# Patient Record
Sex: Male | Born: 2000 | Race: White | Hispanic: No | Marital: Single
Health system: Southern US, Community
[De-identification: ages and names within clinical notes are randomized; demographics above are authoritative.]

## PROBLEM LIST (undated history)

## (undated) DIAGNOSIS — E669 Obesity, unspecified: Secondary | ICD-10-CM

## (undated) DIAGNOSIS — I10 Essential (primary) hypertension: Secondary | ICD-10-CM

## (undated) DIAGNOSIS — E785 Hyperlipidemia, unspecified: Secondary | ICD-10-CM

## (undated) HISTORY — DX: Hyperlipidemia, unspecified: E78.5

## (undated) HISTORY — DX: Essential (primary) hypertension: I10

## (undated) HISTORY — DX: Obesity, unspecified: E66.9

---
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
## 2020-09-27 ENCOUNTER — Emergency Department: Payer: Medicaid Other

## 2020-09-27 ENCOUNTER — Emergency Department
Admission: EM | Admit: 2020-09-27 | Discharge: 2020-09-27 | Disposition: A | Discharge status: Home / Self Care | Payer: Medicaid Other | Attending: Emergency Medicine | Admitting: Emergency Medicine

## 2020-09-27 ENCOUNTER — Encounter: Payer: Self-pay | Admitting: Emergency Medicine

## 2020-09-27 ENCOUNTER — Other Ambulatory Visit: Payer: Self-pay

## 2020-09-27 DIAGNOSIS — E860 Dehydration: Secondary | ICD-10-CM | POA: Diagnosis not present

## 2020-09-27 DIAGNOSIS — U071 COVID-19: Secondary | ICD-10-CM

## 2020-09-27 DIAGNOSIS — K529 Noninfective gastroenteritis and colitis, unspecified: Secondary | ICD-10-CM | POA: Diagnosis not present

## 2020-09-27 DIAGNOSIS — E876 Hypokalemia: Secondary | ICD-10-CM

## 2020-09-27 DIAGNOSIS — R000 Tachycardia, unspecified: Secondary | ICD-10-CM | POA: Insufficient documentation

## 2020-09-27 DIAGNOSIS — R059 Cough, unspecified: Secondary | ICD-10-CM | POA: Diagnosis present

## 2020-09-27 DIAGNOSIS — I10 Essential (primary) hypertension: Secondary | ICD-10-CM | POA: Diagnosis not present

## 2020-09-27 DIAGNOSIS — Z79899 Other long term (current) drug therapy: Secondary | ICD-10-CM | POA: Diagnosis not present

## 2020-09-27 DIAGNOSIS — R7401 Elevation of levels of liver transaminase levels: Secondary | ICD-10-CM

## 2020-09-27 HISTORY — DX: Essential (primary) hypertension: I10

## 2020-09-27 LAB — HEPATITIS PANEL, ACUTE
HCV Ab: NONREACTIVE
Hep A IgM: NONREACTIVE
Hep B C IgM: NONREACTIVE
Hepatitis B Surface Ag: NONREACTIVE

## 2020-09-27 LAB — CBC WITH DIFFERENTIAL/PLATELET
Abs Immature Granulocytes: 0.01 10*3/uL (ref 0.00–0.07)
Basophils Absolute: 0 10*3/uL (ref 0.0–0.1)
Basophils Relative: 1 %
Eosinophils Absolute: 0 10*3/uL (ref 0.0–0.5)
Eosinophils Relative: 0 %
HCT: 46.2 % (ref 39.0–52.0)
Hemoglobin: 15.4 g/dL (ref 13.0–17.0)
Immature Granulocytes: 0 %
Lymphocytes Relative: 19 %
Lymphs Abs: 1 10*3/uL (ref 0.7–4.0)
MCH: 27.6 pg (ref 26.0–34.0)
MCHC: 33.3 g/dL (ref 30.0–36.0)
MCV: 82.9 fL (ref 80.0–100.0)
Monocytes Absolute: 0.5 10*3/uL (ref 0.1–1.0)
Monocytes Relative: 9 %
Neutro Abs: 3.8 10*3/uL (ref 1.7–7.7)
Neutrophils Relative %: 71 %
Platelets: 221 10*3/uL (ref 150–400)
RBC: 5.57 MIL/uL (ref 4.22–5.81)
RDW: 12.5 % (ref 11.5–15.5)
WBC: 5.3 10*3/uL (ref 4.0–10.5)
nRBC: 0 % (ref 0.0–0.2)

## 2020-09-27 LAB — COMPREHENSIVE METABOLIC PANEL
ALT: 76 U/L — ABNORMAL HIGH (ref 0–44)
AST: 57 U/L — ABNORMAL HIGH (ref 15–41)
Albumin: 4.3 g/dL (ref 3.5–5.0)
Alkaline Phosphatase: 76 U/L (ref 38–126)
Anion gap: 13 (ref 5–15)
BUN: 8 mg/dL (ref 6–20)
CO2: 28 mmol/L (ref 22–32)
Calcium: 9.2 mg/dL (ref 8.9–10.3)
Chloride: 92 mmol/L — ABNORMAL LOW (ref 98–111)
Creatinine, Ser: 1.05 mg/dL (ref 0.61–1.24)
GFR, Estimated: >60 mL/min (ref >60)
Glucose, Bld: 108 mg/dL — ABNORMAL HIGH (ref 70–99)
Potassium: 3.1 mmol/L — ABNORMAL LOW (ref 3.5–5.1)
Sodium: 133 mmol/L — ABNORMAL LOW (ref 135–145)
Total Bilirubin: 0.6 mg/dL (ref 0.3–1.2)
Total Protein: 8.3 g/dL — ABNORMAL HIGH (ref 6.5–8.1)

## 2020-09-27 LAB — MAGNESIUM: Magnesium: 2.1 mg/dL (ref 1.7–2.4)

## 2020-09-27 LAB — RESP PANEL BY RT-PCR (FLU A&B, COVID) ARPGX2
Influenza A by PCR: NEGATIVE
Influenza B by PCR: NEGATIVE
SARS Coronavirus 2 by RT PCR: POSITIVE — AB

## 2020-09-27 LAB — CK: Total CK: 199 U/L (ref 49–397)

## 2020-09-27 IMAGING — CR DG CHEST 2V
2 series · 2 of 2 positions shown · non-contrast
Comparison: None.

CLINICAL DATA: Tachypnea

EXAM:
CHEST - 2 VIEW

[chest pa]
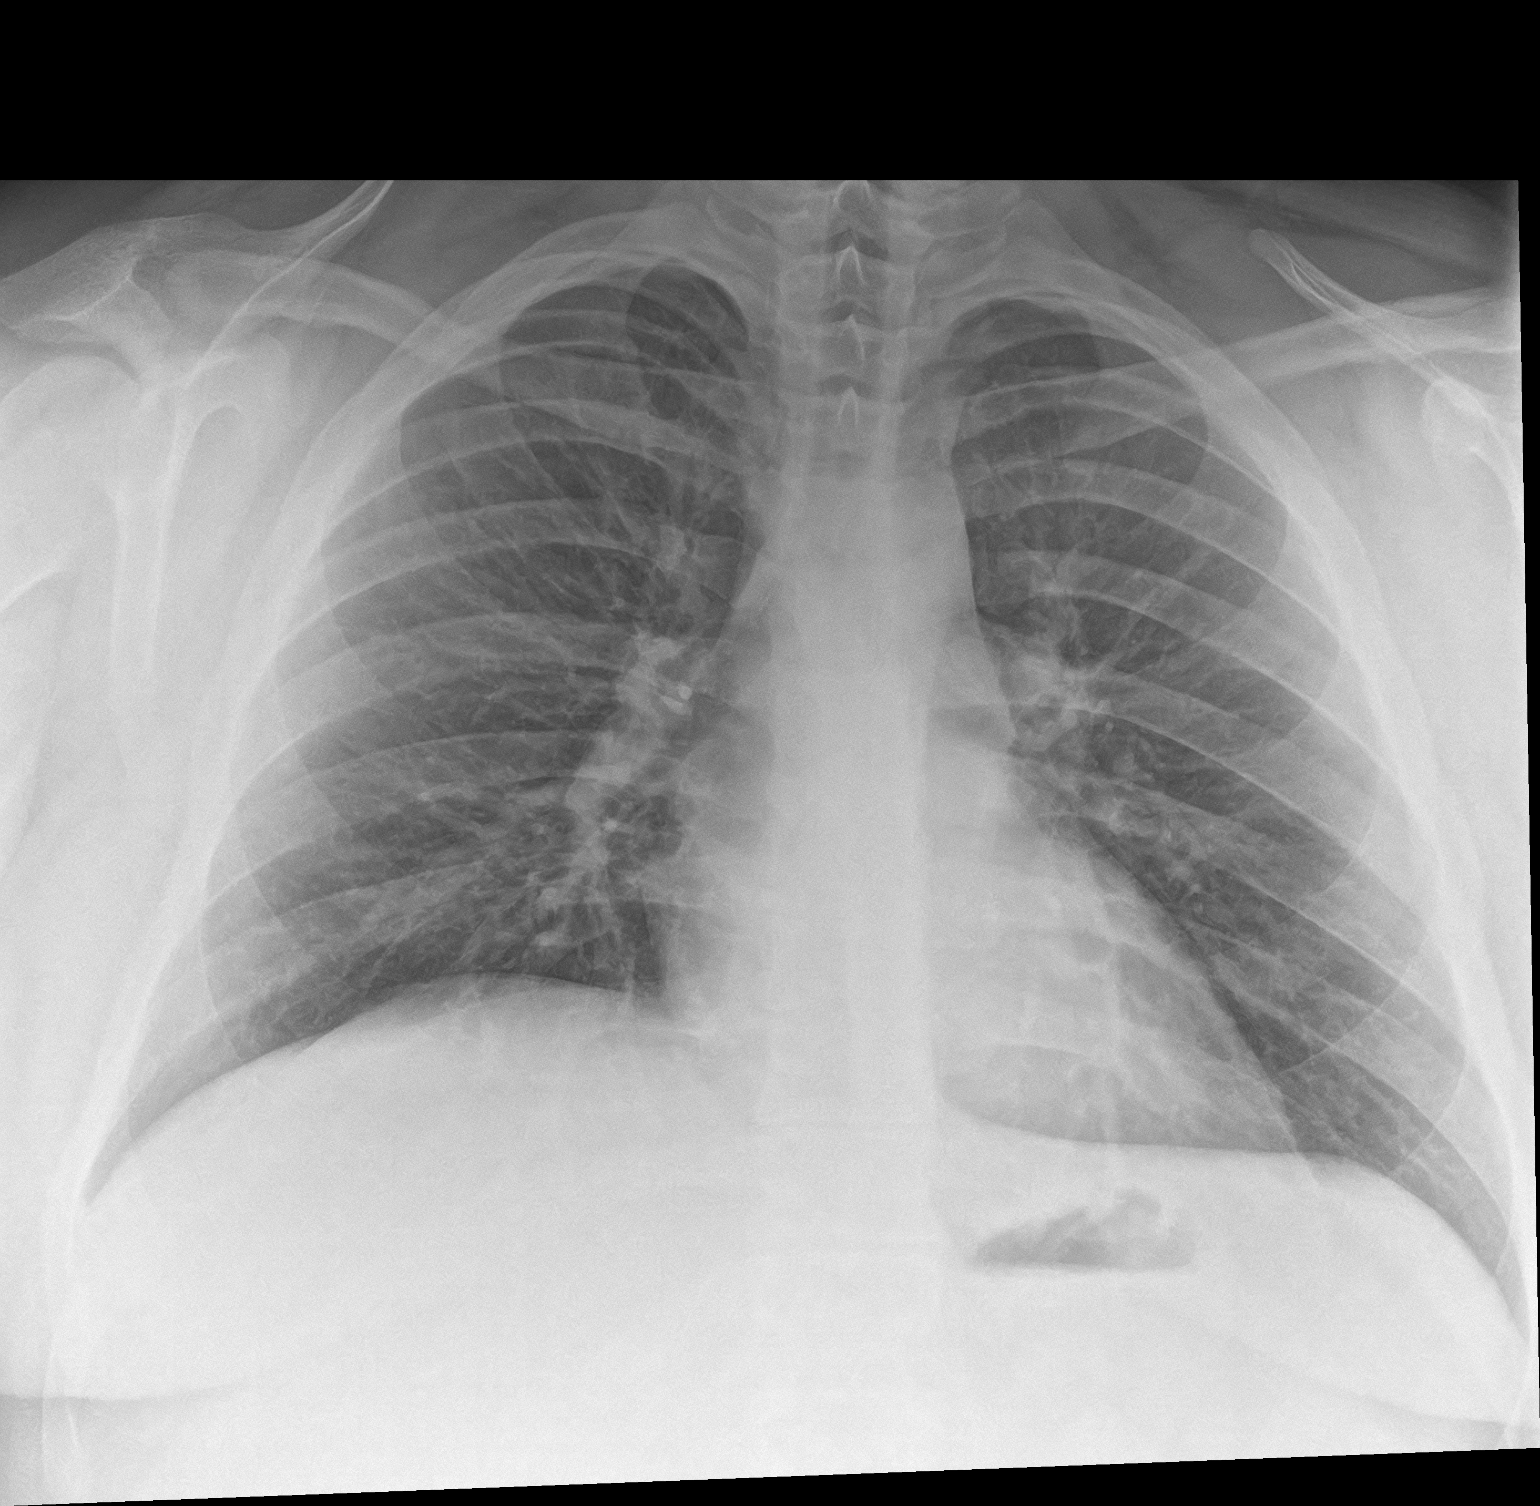

[chest lat]
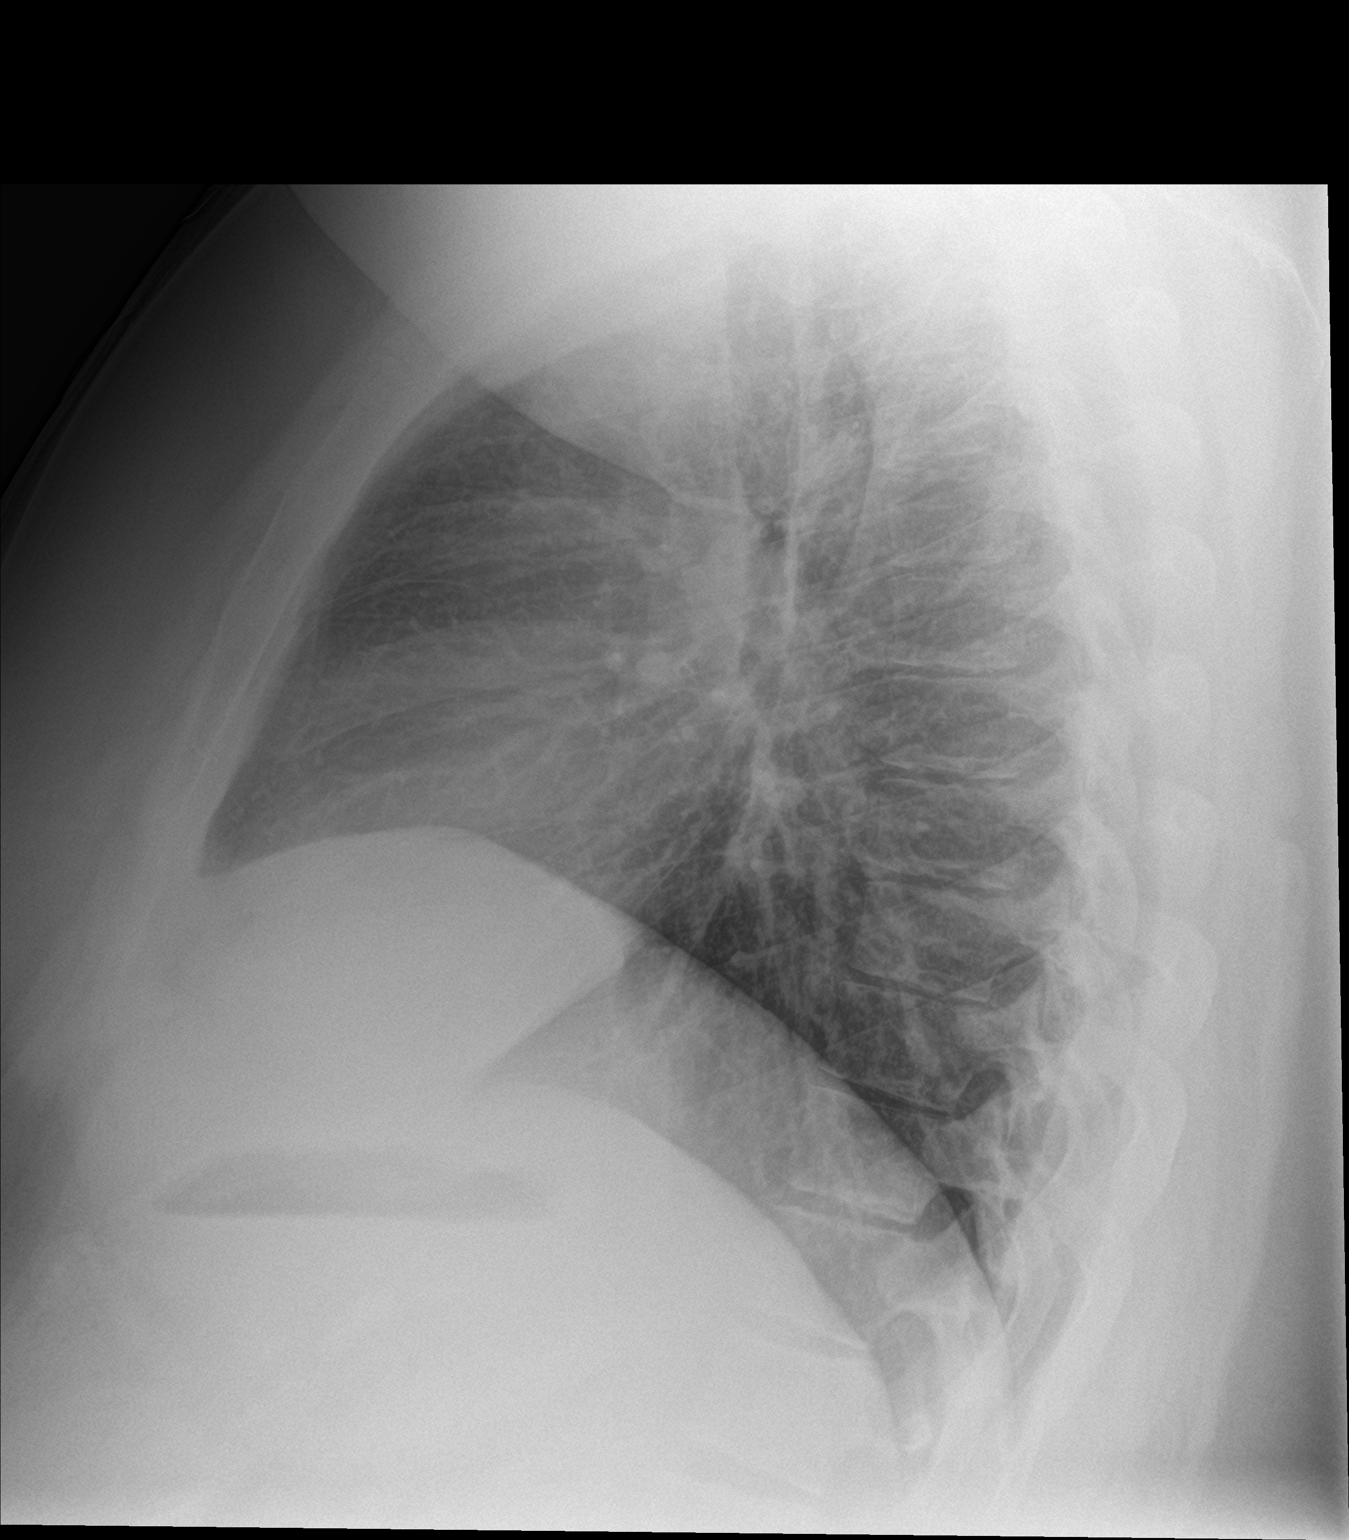

[2 of 2 positions shown; findings below may reference images not displayed]

FINDINGS: Lungs are clear. Heart size and pulmonary vascularity are normal. No
adenopathy. No pneumothorax. No bone lesions.
IMPRESSION: Lungs clear.  Cardiac silhouette normal.

## 2020-09-27 MED ORDER — POTASSIUM CHLORIDE CRYS ER 20 MEQ PO TBCR
40.0000 meq | EXTENDED_RELEASE_TABLET | Freq: Once | ORAL | Status: AC
  Administered 2020-09-27: 40 meq via ORAL
  Filled 2020-09-27: qty 2

## 2020-09-27 MED ORDER — NIRMATRELVIR/RITONAVIR (PAXLOVID) TABLET (RENAL DOSING): 2.0000 | ORAL_TABLET | Freq: Two times a day (BID) | ORAL | 0 refills | Status: AC

## 2020-09-27 MED ORDER — LACTATED RINGERS IV BOLUS
1000.0000 mL | Freq: Once | INTRAVENOUS | Status: AC
  Administered 2020-09-27: 1000 mL via INTRAVENOUS

## 2020-09-27 MED ORDER — ONDANSETRON HCL 4 MG PO TABS: 4.0000 mg | ORAL_TABLET | Freq: Three times a day (TID) | ORAL | 0 refills | Status: AC | PRN

## 2020-09-27 MED ORDER — KETOROLAC TROMETHAMINE 30 MG/ML IJ SOLN
30.0000 mg | Freq: Once | INTRAMUSCULAR | Status: AC
  Administered 2020-09-27: 30 mg via INTRAVENOUS
  Filled 2020-09-27: qty 1

## 2020-09-27 NOTE — ED Triage Notes (Signed)
First Nurse Note:  C/O 'joints locking up and dizziness'. And fever yesterday.  AAOx3.  Skin warm and dry. NAD

## 2020-09-27 NOTE — ED Triage Notes (Signed)
Pt states symptoms started Saturday after dinner, states after awakening vomited and noted joint point, Pt states intermittent joint pain and swelling. Pt states is supposed to take fluid pills and HTN medications however has not been taking to aid in digestion. Pt noted to be diaphoretic and tachycardic in triage.

## 2020-09-27 NOTE — ED Provider Notes (Signed)
[facility] Emergency Department Provider Note  ____________________________________________   Event Date/Time   First MD Initiated Contact with Patient 09/27/20 [id number]     (approximate)  I have reviewed the triage vital signs and the nursing notes.   HISTORY  Chief Complaint No chief complaint on file.   HPI Raymond Archer is a 20 y.o. male with a past medical history of hypertension and obesity who presents  Approximately 3 days of some nonbloody nonbilious vomiting and diarrhea associate with some crampy abdominal pain and body aches and nonproductive cough..  Patient states he felt it was so sore that he can hardly walk yesterday.  Endorses little bit of a headache.  He denies any fevers, chest chills, chest pain, shortness of breath, urinary symptoms, blood in his stool or emesis, rash or focal extremity pain.  No recent falls or injuries.  He denies any illicit drug use, tobacco use or EtOH use.  He thinks he may have gotten food poisoning but is not sure.  He denies any other acute concerns at this time.         Past Medical History:  Diagnosis Date  . Hypertension     There are no problems to display for this patient.   History reviewed. No pertinent surgical history.  Prior to Admission medications   Medication Sig Start Date End Date Taking? Authorizing Provider  nirmatrelvir/ritonavir EUA, renal dosing, (PAXLOVID) TABS Take 2 tablets by mouth 2 (two) times daily for 5 days. Patient GFR is >60. Take nirmatrelvir (150 mg) one tablet twice daily for 5 days and ritonavir (100 mg) one tablet twice daily for 5 days. 09/27/20 10/02/20 Yes Lucrezia Starch, MD  ondansetron (ZOFRAN) 4 MG tablet Take 1 tablet (4 mg total) by mouth every 8 (eight) hours as needed for up to 10 doses for nausea or vomiting. 09/27/20  Yes Lucrezia Starch, MD  cetirizine (ZYRTEC) 10 MG tablet Take by mouth. 10/05/17 10/05/18  [provider]  enalapril (VASOTEC) 20  MG tablet Take by mouth. 10/05/17 10/17/18  [provider]    Allergies Patient has no known allergies.  No family history on file.  Social History Social History   Tobacco Use  . Smoking status: Never Smoker  . Smokeless tobacco: Never Used  Substance Use Topics  . Alcohol use: Not Currently  . Drug use: Not Currently    Review of Systems  Review of Systems  Constitutional: Positive for malaise/fatigue. Negative for chills and fever.  HENT: Negative for sore throat.   Eyes: Negative for pain.  Respiratory: Positive for cough. Negative for stridor.   Cardiovascular: Negative for chest pain.  Gastrointestinal: Positive for abdominal pain, diarrhea, nausea and vomiting.  Musculoskeletal: Positive for myalgias.  Skin: Negative for rash.  Neurological: Negative for seizures, loss of consciousness and headaches.  Psychiatric/Behavioral: Negative for suicidal ideas.  All other systems reviewed and are negative.     ____________________________________________   PHYSICAL EXAM:  VITAL SIGNS: ED Triage Vitals  Enc Vitals Group     BP 09/27/20 0901 (!) 141/111     Pulse Rate 09/27/20 0901 (!) 117     Resp 09/27/20 0901 (!) 26     Temp 09/27/20 0901 99.4 F (37.4 C)     Temp Source 09/27/20 0901 Oral     SpO2 09/27/20 0901 96 %     Weight 09/27/20 0907 (!) 400 lb (181.4 kg)     Height 09/27/20 0907 5\' 9"  (1.753 m)  Head Circumference --      Peak Flow --      Pain Score 09/27/20 0907 0     Pain Loc --      Pain Edu? --      Excl. in [place]? --    Vitals:   09/27/20 0901 09/27/20 1123  BP: (!) 141/111 (!) 148/78  Pulse: (!) 117 98  Resp: (!) 26 20  Temp: 99.4 F (37.4 C)   SpO2: 96% 98%   Physical Exam Vitals and nursing note reviewed.  Constitutional:      Appearance: He is well-developed. He is obese.  HENT:     Head: Normocephalic and atraumatic.     Right Ear: External ear normal.     Left Ear: External ear normal.     Nose: Nose normal.   Eyes:     Conjunctiva/sclera: Conjunctivae normal.  Cardiovascular:     Rate and Rhythm: Regular rhythm. Tachycardia present.     Heart sounds: No murmur heard.   Pulmonary:     Effort: Pulmonary effort is normal. Tachypnea present. No respiratory distress.     Breath sounds: Normal breath sounds.  Abdominal:     Palpations: Abdomen is soft.     Tenderness: There is no abdominal tenderness.  Musculoskeletal:     Cervical back: Neck supple.  Skin:    General: Skin is warm and dry.     Capillary Refill: Capillary refill takes less than 2 seconds.  Neurological:     Mental Status: He is alert and oriented to person, place, and time.  Psychiatric:        Mood and Affect: Mood normal.      ____________________________________________   LABS (all labs ordered are listed, but only abnormal results are displayed)  Labs Reviewed  RESP PANEL BY RT-PCR (FLU A&B, COVID) ARPGX2 - Abnormal; Notable for the following components:      Result Value   SARS Coronavirus 2 by RT PCR POSITIVE (*)    All other components within normal limits  COMPREHENSIVE METABOLIC PANEL - Abnormal; Notable for the following components:   Sodium 133 (*)    Potassium 3.1 (*)    Chloride 92 (*)    Glucose, Bld 108 (*)    Total Protein 8.3 (*)    AST 57 (*)    ALT 76 (*)    All other components within normal limits  CBC WITH DIFFERENTIAL/PLATELET  MAGNESIUM  CK  HEPATITIS PANEL, ACUTE   ____________________________________________  EKG  Sinus tachycardia with ventricular of 123, normal axis, unremarkable intervals without evidence of ischemia or significant underlying arrhythmia. ____________________________________________  RADIOLOGY  ED MD interpretation: No focal consolidation, large effusion, significant edema, pneumothorax or any other clear acute thoracic process.  Official radiology report(s): DG Chest 2 View  Result Date: 09/27/2020 CLINICAL DATA:  Tachypnea EXAM: CHEST - 2 VIEW  COMPARISON:  None. FINDINGS: Lungs are clear. Heart size and pulmonary vascularity are normal. No adenopathy. No pneumothorax. No bone lesions. IMPRESSION: Lungs clear.  Cardiac silhouette normal. Electronically Signed   By: Lowella Grip III M.D.   On: 09/27/2020 10:29    ____________________________________________   PROCEDURES  Procedure(s) performed (including Critical Care):  Procedures   ____________________________________________   INITIAL IMPRESSION / ASSESSMENT AND PLAN / ED COURSE      Patient presents with above to history exam for assessment of some nonbloody nonbilious vomiting, diarrhea, crampy abdominal pain and cough.  On arrival he is tachycardic and hypertensive with otherwise stable  vital signs on room air.  He does appear dehydrated on exam.  Differential includes metabolic derangements, acute bowel process including close COVID and influenza, bronchitis with an element of gastroenteritis.  He has no abdominal tenderness leukocytosis or fever to suggest appendicitis, diverticulitis, cholecystitis or pancreatitis at this time.  CBC shows no leukocytosis or acute anemia.  Chest x-ray has no evidence of pneumonia.  ECG shows no evidence of ischemia.  CMP shows a K of 3.1 and mild transaminitis with otherwise no significant electrolyte or metabolic derangements.  CK and magnesium are unremarkable.  Patient is found to be COVID-positive.  Suspect this is likely etiology of her transaminitis although we will send hepatitis panel.  Advised that he can follow-up this result with his PCP in the next 2 or 3 days.  Given he had improvement his heart rate after IV fluids and is tolerating p.o. I think he is stable for discharge with outpatient follow-up.  Rx written for Zofran and Paxil.  Discharged stable condition.  Strict return precautions advised and discussed.        ____________________________________________   FINAL CLINICAL IMPRESSION(S) / ED DIAGNOSES  Final  diagnoses:  Gastroenteritis  Dehydration  Hypokalemia  Transaminitis  COVID    Medications  lactated ringers bolus 1,000 mL (1,000 mLs Intravenous New Bag/Given 09/27/20 0940)  potassium chloride SA (KLOR-CON) CR tablet 40 mEq (40 mEq Oral Given 09/27/20 1012)  ketorolac (TORADOL) 30 MG/ML injection 30 mg (30 mg Intravenous Given 09/27/20 1012)     ED Discharge Orders         Ordered    nirmatrelvir/ritonavir EUA, renal dosing, (PAXLOVID) TABS  2 times daily        09/27/20 1109    ondansetron (ZOFRAN) 4 MG tablet  Every 8 hours PRN        09/27/20 1109           Note:  This document was prepared using Dragon voice recognition software and may include unintentional dictation errors.   Lucrezia Starch, MD 09/27/20 1128

## 2020-09-27 NOTE — ED Notes (Signed)
Pt verbalized understanding of d/c instructions at this time. Prescriptions and follow-up care reviewed at this time. Pt ambulatory to ED lobby, NAD noted, RR even and unlabored.

## 2020-11-16 ENCOUNTER — Encounter: Payer: Self-pay | Admitting: Intensive Care

## 2020-11-16 ENCOUNTER — Emergency Department
Admission: EM | Admit: 2020-11-16 | Discharge: 2020-11-16 | Disposition: A | Discharge status: Home / Self Care | Payer: Medicaid Other | Attending: Emergency Medicine | Admitting: Emergency Medicine

## 2020-11-16 ENCOUNTER — Other Ambulatory Visit: Payer: Self-pay

## 2020-11-16 DIAGNOSIS — K602 Anal fissure, unspecified: Secondary | ICD-10-CM | POA: Diagnosis not present

## 2020-11-16 DIAGNOSIS — Z79899 Other long term (current) drug therapy: Secondary | ICD-10-CM | POA: Diagnosis not present

## 2020-11-16 DIAGNOSIS — I10 Essential (primary) hypertension: Secondary | ICD-10-CM | POA: Diagnosis not present

## 2020-11-16 DIAGNOSIS — L304 Erythema intertrigo: Secondary | ICD-10-CM | POA: Diagnosis not present

## 2020-11-16 DIAGNOSIS — R21 Rash and other nonspecific skin eruption: Secondary | ICD-10-CM | POA: Diagnosis present

## 2020-11-16 LAB — CBC
HCT: 41.1 % (ref 39.0–52.0)
Hemoglobin: 14.2 g/dL (ref 13.0–17.0)
MCH: 28.9 pg (ref 26.0–34.0)
MCHC: 34.5 g/dL (ref 30.0–36.0)
MCV: 83.5 fL (ref 80.0–100.0)
Platelets: 316 10*3/uL (ref 150–400)
RBC: 4.92 MIL/uL (ref 4.22–5.81)
RDW: 12.7 % (ref 11.5–15.5)
WBC: 9.1 10*3/uL (ref 4.0–10.5)
nRBC: 0 % (ref 0.0–0.2)

## 2020-11-16 LAB — URINALYSIS, COMPLETE (UACMP) WITH MICROSCOPIC
Bacteria, UA: NONE SEEN
Bilirubin Urine: NEGATIVE
Glucose, UA: NEGATIVE mg/dL
Hgb urine dipstick: NEGATIVE
Ketones, ur: NEGATIVE mg/dL
Leukocytes,Ua: NEGATIVE
Nitrite: NEGATIVE
Protein, ur: NEGATIVE mg/dL
Specific Gravity, Urine: 1.019 (ref 1.005–1.030)
Squamous Epithelial / HPF: NONE SEEN (ref 0–5)
pH: 6 (ref 5.0–8.0)

## 2020-11-16 LAB — COMPREHENSIVE METABOLIC PANEL
ALT: 36 U/L (ref 0–44)
AST: 21 U/L (ref 15–41)
Albumin: 3.9 g/dL (ref 3.5–5.0)
Alkaline Phosphatase: 57 U/L (ref 38–126)
Anion gap: 10 (ref 5–15)
BUN: 18 mg/dL (ref 6–20)
CO2: 26 mmol/L (ref 22–32)
Calcium: 9.4 mg/dL (ref 8.9–10.3)
Chloride: 98 mmol/L (ref 98–111)
Creatinine, Ser: 0.7 mg/dL (ref 0.61–1.24)
GFR, Estimated: >60 mL/min (ref >60)
Glucose, Bld: 99 mg/dL (ref 70–99)
Potassium: 3.7 mmol/L (ref 3.5–5.1)
Sodium: 134 mmol/L — ABNORMAL LOW (ref 135–145)
Total Bilirubin: 0.8 mg/dL (ref 0.3–1.2)
Total Protein: 7.5 g/dL (ref 6.5–8.1)

## 2020-11-16 LAB — LIPASE, BLOOD: Lipase: 28 U/L (ref 11–51)

## 2020-11-16 MED ORDER — FLUCONAZOLE 150 MG PO TABS: 150.0000 mg | ORAL_TABLET | ORAL | 0 refills | Status: AC

## 2020-11-16 NOTE — ED Triage Notes (Signed)
Patient c/o abdominal pain, rectal bleeding, and diarrhea that started today.

## 2020-11-16 NOTE — Discharge Instructions (Addendum)
? ?  Please return to the emergency room right away if you are to develop a fever, severe nausea, your pain becomes severe or worsens, you are unable to keep food down, begin vomiting any dark or bloody fluid, you develop any dark or bloody stools, feel dehydrated, or other new concerns or symptoms arise. ? ?

## 2020-11-16 NOTE — ED Provider Notes (Signed)
[facility] Emergency Department Provider Note   ____________________________________________   Event Date/Time   First MD Initiated Contact with Patient 11/16/20 1125     (approximate)  I have reviewed the triage vital signs and the nursing notes.   HISTORY  Chief Complaint Rectal Bleeding and Diarrhea    HPI Raymond Archer is a 20 y.o. male ports no major medical history except previously broke a bone in his hypertension  For the last 10 years he notes at times that he will get a little bit of discomfort in around his rectum and sometimes will see a small amount of blood when he wipes the toilet and feels like his area around his rectum will get irritated.  This morning he woke up and he got up to use the bathroom that he had about 2-3 loose movements with some blood noticed on tissue when he wipes.  Denies that there is any blood in his stool itself.  Also feels little bit of irritation or discomfort around his rectum.  No nausea vomiting no fevers or chills.  Denies any emesis.  Reports is not having any abdominal pain no stomach pains, the area of irritation is around his rectum and only causes pain and discomfort when he sits.  Currently not in any pain or discomfort as he is laying down but if he sits down he notices the area feels sore   Past Medical History:  Diagnosis Date   Hyperlipidemia    Hypertension    Obesity     There are no problems to display for this patient.   History reviewed. No pertinent surgical history.  Prior to Admission medications   Medication Sig Start Date End Date Taking? Authorizing Provider  fluconazole (DIFLUCAN) 150 MG tablet Take 1 tablet (150 mg total) by mouth once a week. 11/16/20  Yes Delman Kitten, MD  cetirizine (ZYRTEC) 10 MG tablet Take by mouth. 10/05/17 10/05/18  [provider]  enalapril (VASOTEC) 20 MG tablet Take by mouth. 10/05/17 10/17/18  [provider]  ondansetron  (ZOFRAN) 4 MG tablet Take 1 tablet (4 mg total) by mouth every 8 (eight) hours as needed for up to 10 doses for nausea or vomiting. 09/27/20   Lucrezia Starch, MD    Allergies Bee pollen and Fluticasone propionate  History reviewed. No pertinent family history.  Social History Social History   Tobacco Use   Smoking status: Never   Smokeless tobacco: Never  Vaping Use   Vaping Use: Every day  Substance Use Topics   Alcohol use: Not Currently   Drug use: Not Currently    Review of Systems Constitutional: No fever/chills ENT: No sore throat. Cardiovascular: Denies chest pain. Respiratory: Denies shortness of breath. Gastrointestinal: No abdominal pain.  See HPI Genitourinary: Negative for dysuria. Musculoskeletal: Negative for back pain. Skin: Negative for rash reports he cannot see the area around his rectum where it does feel irritated. Neurological: Negative for headaches or weakness.    ____________________________________________   PHYSICAL EXAM:  VITAL SIGNS: ED Triage Vitals [11/16/20 1001]  Enc Vitals Group     BP 128/76     Pulse Rate (!) 101     Resp 18     Temp 98.7 F (37.1 C)     Temp Source Oral     SpO2 96 %     Weight (!) 380 lb (172.4 kg)     Height '5\' 10"'$  (1.778 m)     Head Circumference  Peak Flow      Pain Score 7     Pain Loc      Pain Edu?      Excl. in [place]?     Constitutional: Alert and oriented. Well appearing and in no acute distress.  Ambulates without difficulty.  Very pleasant. Eyes: Conjunctivae are normal. Head: Atraumatic. Nose: No congestion/rhinnorhea. Mouth/Throat: Mucous membranes are moist. Neck: No stridor.  Cardiovascular: Normal rate, regular rhythm.  Good peripheral circulation. Respiratory: Normal respiratory effort.  No retractions.  Gastrointestinal: Soft and nontender. No distention. Genitourinary: Normal appearance of the buttock, however in the gluteal crease the patient has an area of skin darkening  slight irritation and then also what appears to be a very small rectal fissure without evidence of abscess.  No hemorrhoids noted.  No active bleeding.  Morbidly obese.  No right lower quadrant pain.  No pain McBurney's point.  No reproducible abdominal pain on exam. Musculoskeletal: No lower extremity tenderness nor edema. Neurologic:  Normal speech and language. No gross focal neurologic deficits are appreciated.  Skin:  Skin is warm, dry and intact. No rash noted. Psychiatric: Mood and affect are normal. Speech and behavior are normal.  ____________________________________________   LABS (all labs ordered are listed, but only abnormal results are displayed)  Labs Reviewed  COMPREHENSIVE METABOLIC PANEL - Abnormal; Notable for the following components:      Result Value   Sodium 134 (*)    All other components within normal limits  URINALYSIS, COMPLETE (UACMP) WITH MICROSCOPIC - Abnormal; Notable for the following components:   Color, Urine YELLOW (*)    APPearance CLEAR (*)    All other components within normal limits  LIPASE, BLOOD  CBC   ____________________________________________  EKG   ____________________________________________  RADIOLOGY   ____________________________________________   PROCEDURES  Procedure(s) performed: None  Procedures  Critical Care performed: No  ____________________________________________   INITIAL IMPRESSION / ASSESSMENT AND PLAN / ED COURSE  Pertinent labs & imaging results that were available during my care of the patient were reviewed by me and considered in my medical decision making (see chart for details).   Patient presents for gluteal cleft irritation, clinical exam appears consistent with gluteal cleft intertrigo.  Also see a small area that appears to be a small rectal fissure.  Patient reports has had multiple issues with this at the same she is primary care treats often with [employer].  We discussed treatment with  different creams or powders she reports that given he sweats a lot this is very difficult he would be interested in oral treatment option.  Will prescribe fluconazole q. weekly for 4 weeks.  Follow-up with PCP.  Very reassuring exam.  No associate abdominal pain nausea vomiting no fever blood work also reassuring no elevated white count.  No clinical signs or symptoms or exam findings suggestive of acute bacterial illness, appendicitis, cholelithiasis, enteritis etc.  Patient understanding comfortable plan for discharge.      ____________________________________________   FINAL CLINICAL IMPRESSION(S) / ED DIAGNOSES  Final diagnoses:  Intertrigo  Rectal fissure        Note:  This document was prepared using Dragon voice recognition software and may include unintentional dictation errors       Delman Kitten, MD 11/16/20 1200

## 2020-12-02 ENCOUNTER — Other Ambulatory Visit: Payer: Self-pay

## 2020-12-02 ENCOUNTER — Emergency Department
Admission: EM | Admit: 2020-12-02 | Discharge: 2020-12-02 | Disposition: A | Discharge status: Home / Self Care | Payer: Medicaid Other | Attending: Emergency Medicine | Admitting: Emergency Medicine

## 2020-12-02 ENCOUNTER — Emergency Department: Payer: Medicaid Other

## 2020-12-02 ENCOUNTER — Encounter: Payer: Self-pay | Admitting: Emergency Medicine

## 2020-12-02 DIAGNOSIS — I10 Essential (primary) hypertension: Secondary | ICD-10-CM | POA: Insufficient documentation

## 2020-12-02 DIAGNOSIS — G9389 Other specified disorders of brain: Secondary | ICD-10-CM | POA: Diagnosis not present

## 2020-12-02 DIAGNOSIS — Z79899 Other long term (current) drug therapy: Secondary | ICD-10-CM | POA: Insufficient documentation

## 2020-12-02 DIAGNOSIS — R519 Headache, unspecified: Secondary | ICD-10-CM | POA: Diagnosis present

## 2020-12-02 LAB — COMPREHENSIVE METABOLIC PANEL
ALT: 32 U/L (ref 0–44)
AST: 20 U/L (ref 15–41)
Albumin: 4 g/dL (ref 3.5–5.0)
Alkaline Phosphatase: 62 U/L (ref 38–126)
Anion gap: 9 (ref 5–15)
BUN: 9 mg/dL (ref 6–20)
CO2: 30 mmol/L (ref 22–32)
Calcium: 9.3 mg/dL (ref 8.9–10.3)
Chloride: 95 mmol/L — ABNORMAL LOW (ref 98–111)
Creatinine, Ser: 0.73 mg/dL (ref 0.61–1.24)
GFR, Estimated: >60 mL/min (ref >60)
Glucose, Bld: 103 mg/dL — ABNORMAL HIGH (ref 70–99)
Potassium: 4.5 mmol/L (ref 3.5–5.1)
Sodium: 134 mmol/L — ABNORMAL LOW (ref 135–145)
Total Bilirubin: 0.9 mg/dL (ref 0.3–1.2)
Total Protein: 7.5 g/dL (ref 6.5–8.1)

## 2020-12-02 LAB — CBC WITH DIFFERENTIAL/PLATELET
Abs Immature Granulocytes: 0.01 10*3/uL (ref 0.00–0.07)
Basophils Absolute: 0.1 10*3/uL (ref 0.0–0.1)
Basophils Relative: 1 %
Eosinophils Absolute: 0.1 10*3/uL (ref 0.0–0.5)
Eosinophils Relative: 1 %
HCT: 44.4 % (ref 39.0–52.0)
Hemoglobin: 14.9 g/dL (ref 13.0–17.0)
Immature Granulocytes: 0 %
Lymphocytes Relative: 22 %
Lymphs Abs: 2 10*3/uL (ref 0.7–4.0)
MCH: 28.9 pg (ref 26.0–34.0)
MCHC: 33.6 g/dL (ref 30.0–36.0)
MCV: 86.2 fL (ref 80.0–100.0)
Monocytes Absolute: 0.4 10*3/uL (ref 0.1–1.0)
Monocytes Relative: 5 %
Neutro Abs: 6.8 10*3/uL (ref 1.7–7.7)
Neutrophils Relative %: 71 %
Platelets: 289 10*3/uL (ref 150–400)
RBC: 5.15 MIL/uL (ref 4.22–5.81)
RDW: 12.5 % (ref 11.5–15.5)
WBC: 9.5 10*3/uL (ref 4.0–10.5)
nRBC: 0 % (ref 0.0–0.2)

## 2020-12-02 LAB — D-DIMER, QUANTITATIVE: D-Dimer, Quant: <0.27 ug/mL-FEU (ref 0.00–0.50)

## 2020-12-02 IMAGING — MR MR HEAD WO/W CM
16 series · 47 of 48 positions shown · IV contrast (10ml Gadavist)
Comparison: None.

CLINICAL DATA: Headache. Recent COVID infection and paxlovid
treatment

EXAM:
MRI HEAD WITHOUT AND WITH CONTRAST
MRI CERVICAL SPINE WITHOUT AND WITH CONTRAST
TECHNIQUE: Multiplanar, multiecho pulse sequences of the brain and surrounding
structures, and cervical spine, to include the craniocervical
junction and cervicothoracic junction, were obtained without and
with intravenous contrast.
CONTRAST:  10mL GADAVIST GADOBUTROL 1 MMOL/ML IV SOLN

[Series 5: ax dwi_tracew · axial · 3.0mm · 0.65mm/px · z∈[-74,+78]mm · 3 of 48 slices shown]
[im 1/48]
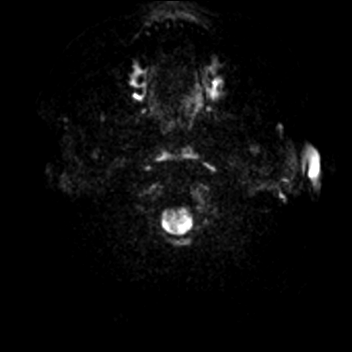
[im 24/48]
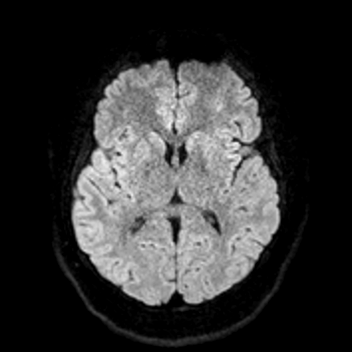
[im 48/48]
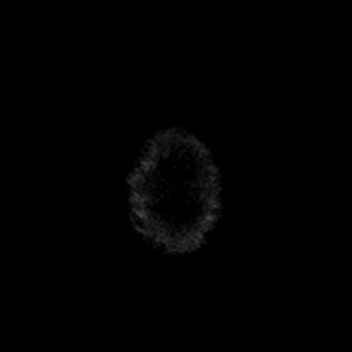

[Series 6: ax dwi_adc · axial · 3.0mm · 0.65mm/px · z∈[-74,+78]mm · 2 of 48 slices shown]
[im 1/48]
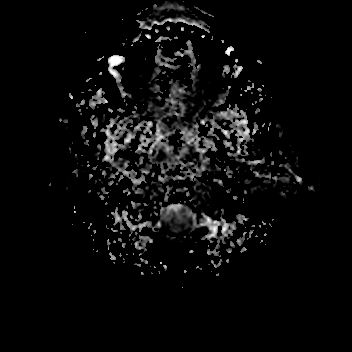
[im 48/48]
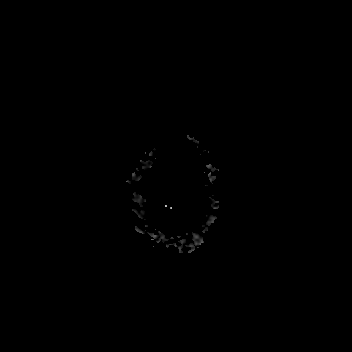

[Series 7: cor dwi_tracew · coronal · 5.0mm · 0.65mm/px · 2 of 40 slices shown]
[im 1/40]
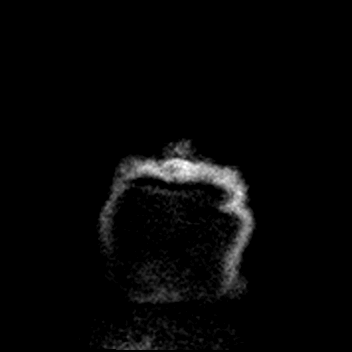
[im 40/40]
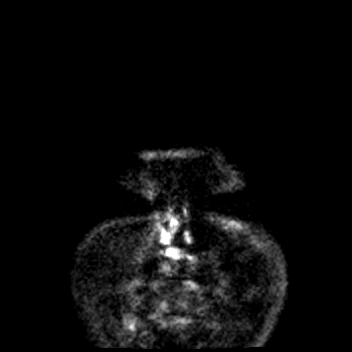

[Series 8: cor dwi_adc · coronal · 5.0mm · 0.65mm/px · 2 of 40 slices shown]
[im 1/40]
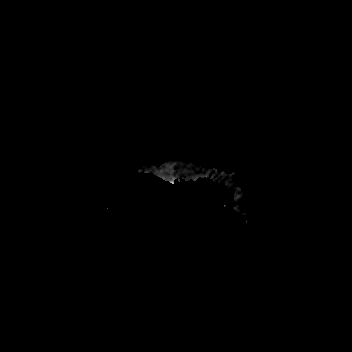
[im 40/40]
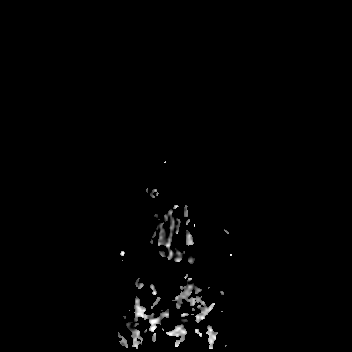

[Series 9: T1 · sagittal · 5.0mm · 0.62mm/px · 1 of 23 slices shown (1 of 2)]
[im 1/23]
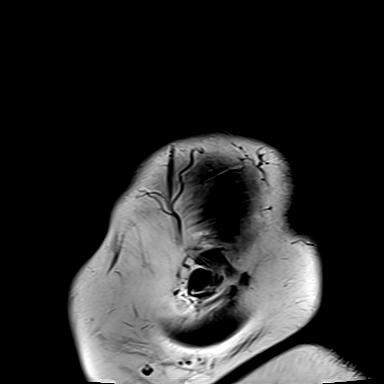

[Series 10: T2 · axial · 5.0mm · 0.53mm/px · 1 of 25 slices shown (1 of 2)]
[im 1/25]
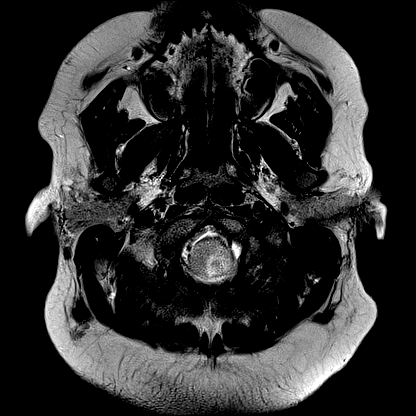

[Series 11: mag_images · axial · 3.0mm · 0.90mm/px · z∈[-85,+89]mm · 3 of 60 slices shown]
[im 1/60]
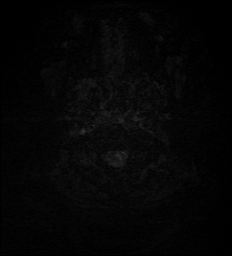
[im 30/60]
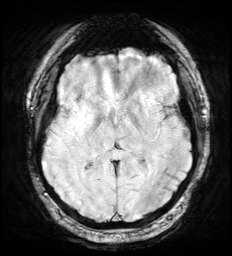
[im 60/60]
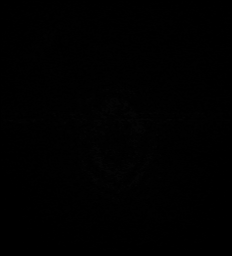

[Series 12: pha_images · axial · 3.0mm · 0.90mm/px · z∈[-85,+89]mm · 3 of 59 slices shown]
[im 1/59]
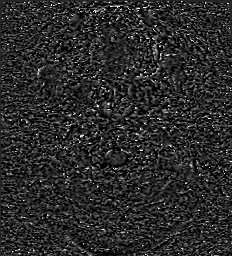
[im 30/59]
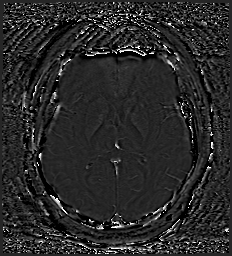
[im 59/59]
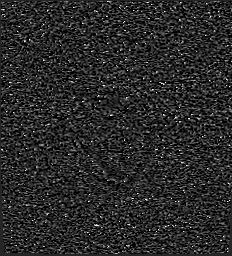

[Series 13: swi_images · axial · 3.0mm · 0.90mm/px · z∈[-85,+89]mm · 3 of 60 slices shown]
[im 1/60]
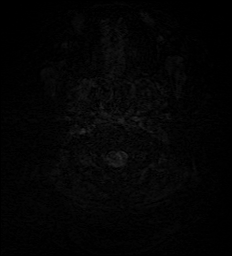
[im 30/60]
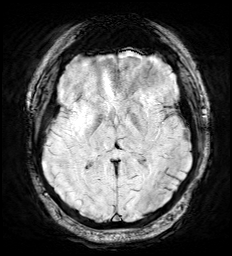
[im 60/60]
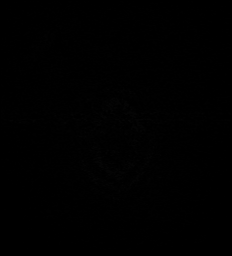

[Series 15: FLAIR · axial · 3.0mm · 0.53mm/px · z∈[-77,+87]mm · 3 of 60 slices shown]
[im 1/60]
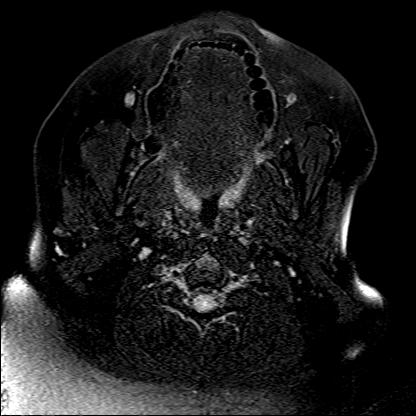
[im 30/60]
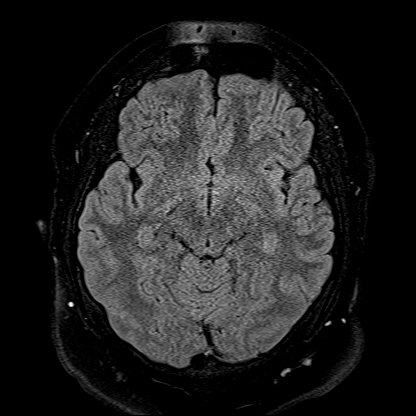
[im 60/60]
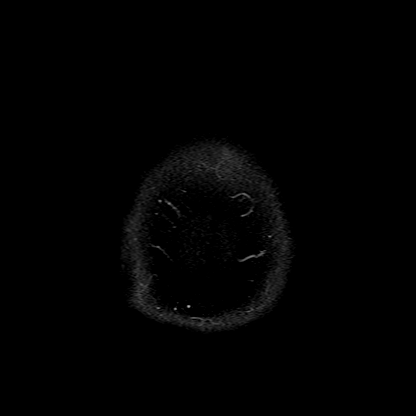

[Series 16: T1 · axial · 1.0mm · 0.98mm/px · z∈[-113,+75]mm · 9 of 192 slices shown (2 of 2)]
[im 1/192]
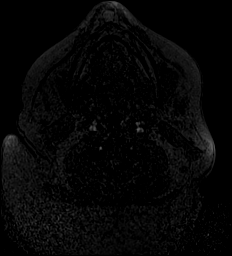
[im 22/192]
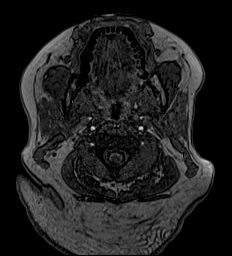
[im 43/192]
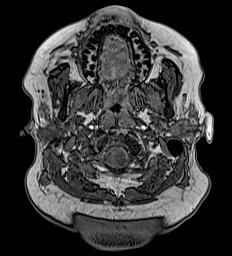
[im 64/192]
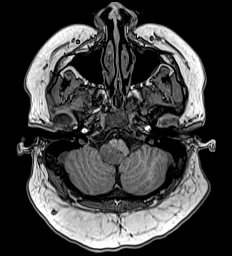
[im 85/192]
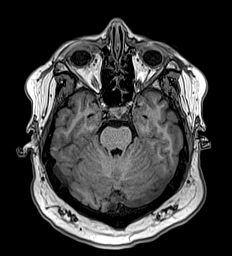
[im 107/192]
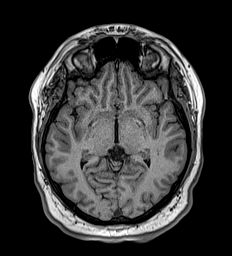
[im 128/192]
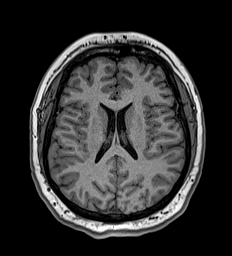
[im 170/192]
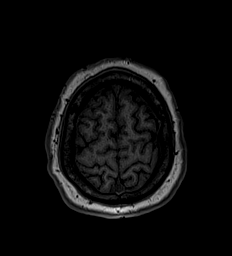
[im 192/192]
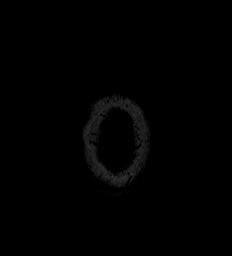

[Series 17: T2 · axial · 5.0mm · 0.53mm/px · z∈[-111,+71]mm · 2 of 32 slices shown (2 of 2)]
[im 1/32]
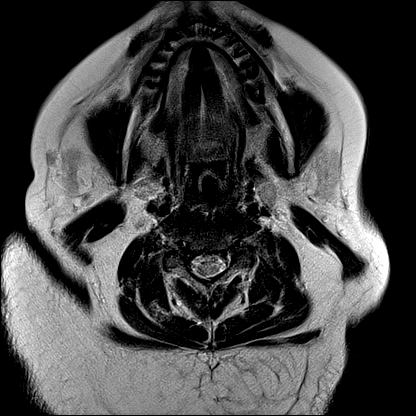
[im 32/32]
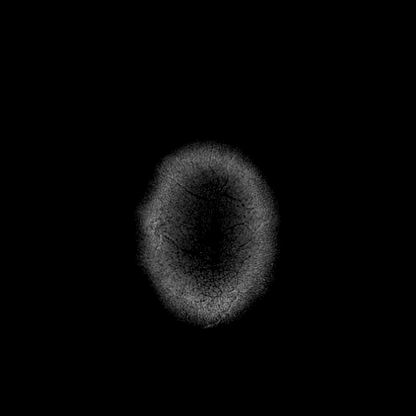

[Series 28: T2 post-contrast · coronal · 5.0mm · 0.57mm/px · 1 of 29 slices shown]
[im 1/29]
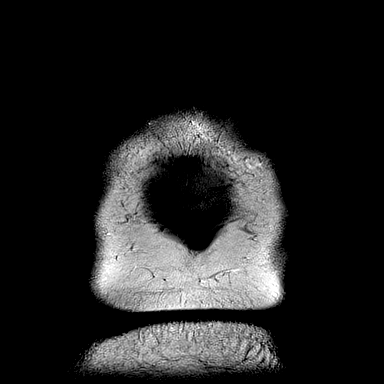

[Series 29: T1 post-contrast · axial · 1.0mm · 0.98mm/px · z∈[-113,+75]mm · 10 of 192 slices shown (1 of 3)]
[im 1/192]
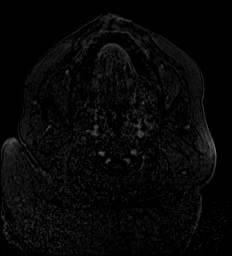
[im 22/192]
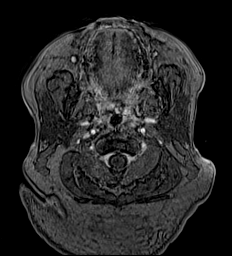
[im 43/192]
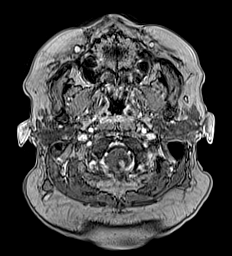
[im 64/192]
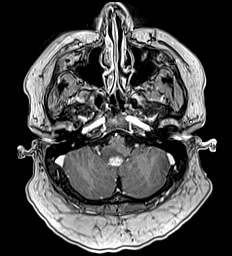
[im 85/192]
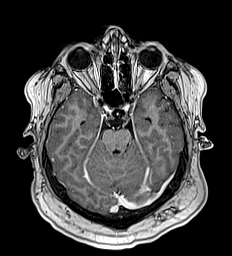
[im 107/192]
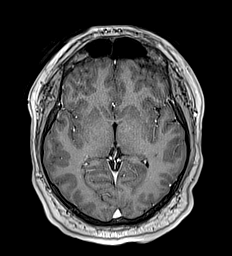
[im 128/192]
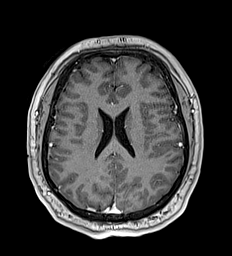
[im 149/192]
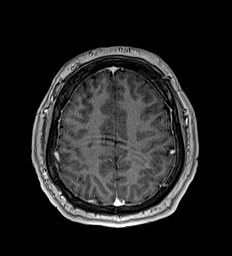
[im 170/192]
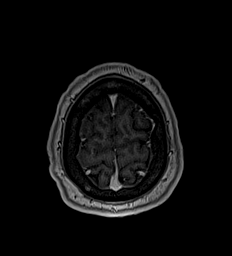
[im 192/192]
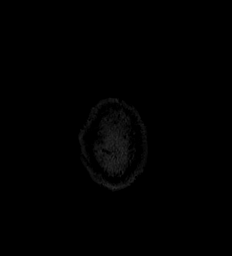

[Series 30: T1 post-contrast · coronal · 5.0mm · 0.57mm/px · 1 of 29 slices shown (2 of 3)]
[im 1/29]
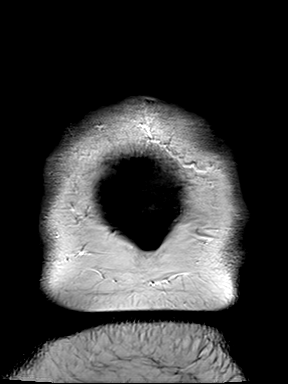

[Series 31: T1 post-contrast · sagittal · 5.0mm · 0.62mm/px · 1 of 23 slices shown (3 of 3)]
[im 1/23]
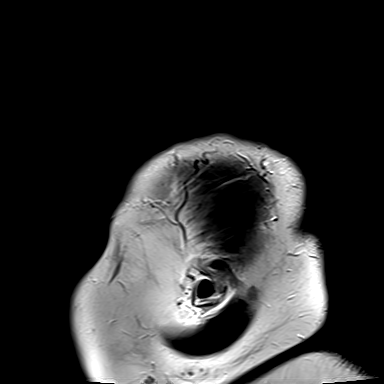

[47 of 48 positions shown; findings below may reference images not displayed]

FINDINGS: MRI HEAD FINDINGS

Brain: Ventricle size normal. Cerebral white matter normal. Negative
for acute infarct. No areas of hemorrhage or fluid collection

Mass lesion involving the medulla and upper cervical spinal cord.
The mass is expansile and appears intramedullary but does protrude
into the lower aspect of the fourth ventricle on the right. The mass
shows mild patchy enhancement. No blood products or cystic change.
No obstructive hydrocephalus.

Vascular: Normal arterial flow voids.

Skull and upper cervical spine: Negative

Sinuses/Orbits: Mild mucosal edema paranasal sinuses. Negative orbit

Other: None

MRI CERVICAL SPINE FINDINGS

Alignment: Normal

Vertebrae: Normal bone marrow.  Negative for fracture or mass

Cord: Expansile intramedullary mass centered at the craniocervical
junction. The mass has increased signal on T2. The mass infiltrates
the medulla, right greater than left. The mass appears to protrude
into the base of the fourth ventricle on the right. The mass
measures approximately 7 cm in length and shows mild enhancement
especially in the posterior medulla region. No syrinx. No evidence
of blood products.

Posterior Fossa, vertebral arteries, paraspinal tissues: Expansile
intramedullary mass lesion centered at the craniocervical junction
as described above.

No paraspinous mass or adenopathy in the neck.

Disc levels: Disc spaces are normal. No disc degeneration, disc
protrusion, or spinal stenosis.
IMPRESSION: 1. Large intramedullary expansile mass centered at the cranial
cervical junction. There is extension into the medulla and into the
upper cervical spinal cord. No hemorrhage or cystic change. There is
mild patchy enhancement especially in the posterior medulla
component of the mass. Findings most compatible with primary neural
neoplasm such as brainstem glioma or astrocytoma.
2. Negative for hydrocephalus.
3. These results were called by telephone at the time of
interpretation on [DATE] at [DATE] to provider KAKI ,
who verbally acknowledged these results.

## 2020-12-02 IMAGING — MR MR CERVICAL SPINE WO/W CM
5 of 8 series · 28 of 48 positions shown · IV contrast (gadavist)
Comparison: None.

CLINICAL DATA: Headache. Recent COVID infection and paxlovid
treatment

EXAM:
MRI HEAD WITHOUT AND WITH CONTRAST
MRI CERVICAL SPINE WITHOUT AND WITH CONTRAST
TECHNIQUE: Multiplanar, multiecho pulse sequences of the brain and surrounding
structures, and cervical spine, to include the craniocervical
junction and cervicothoracic junction, were obtained without and
with intravenous contrast.
CONTRAST:  10mL GADAVIST GADOBUTROL 1 MMOL/ML IV SOLN

[Series 22: T2 · sagittal · 3.0mm · 0.62mm/px · 4 of 15 slices shown (1 of 2)]
[im 1/15]
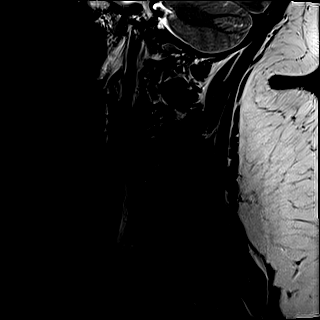
[im 5/15]
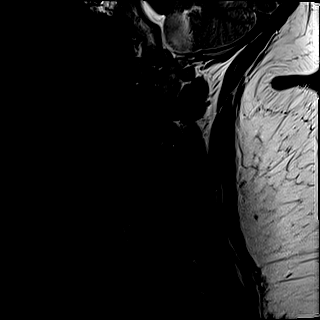
[im 10/15]
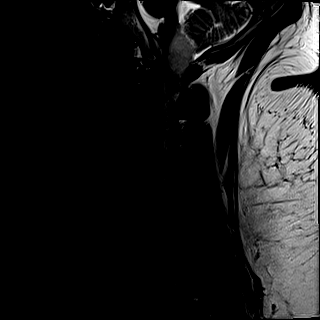
[im 15/15]
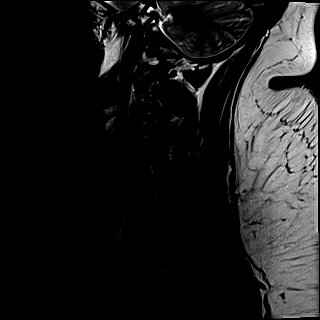

[Series 24: STIR · sagittal · 3.0mm · 0.62mm/px · 4 of 15 slices shown]
[im 1/15]
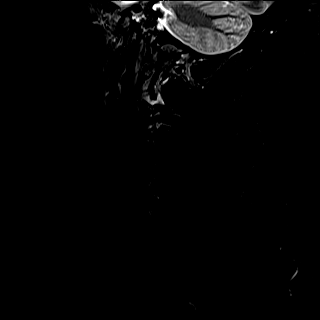
[im 5/15]
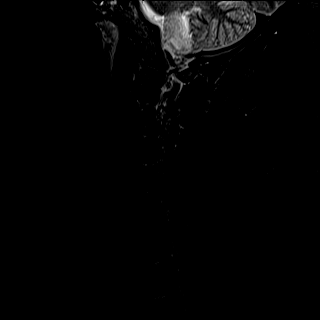
[im 10/15]
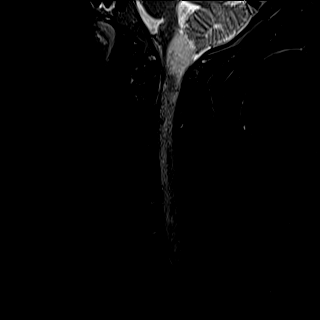
[im 15/15]
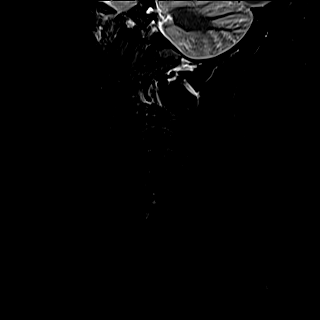

[Series 25: T2 · axial · 3.0mm · 0.70mm/px · z∈[-239,-138]mm · 8 of 30 slices shown (2 of 2)]
[im 1/30]
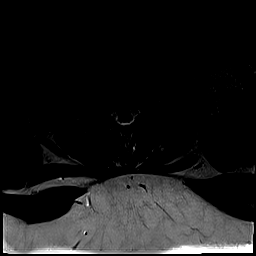
[im 5/30]
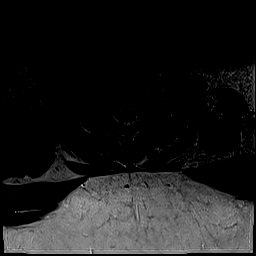
[im 9/30]
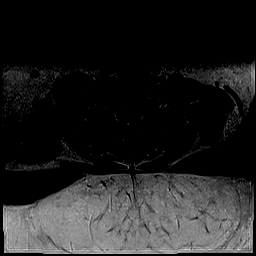
[im 13/30]
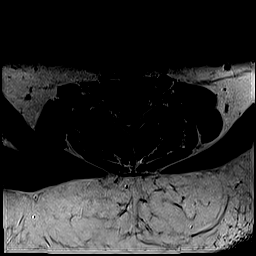
[im 17/30]
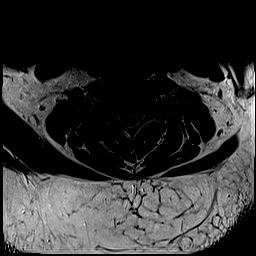
[im 21/30]
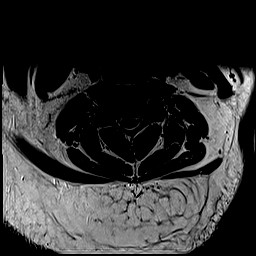
[im 25/30]
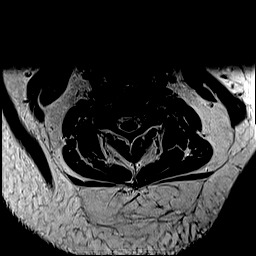
[im 30/30]
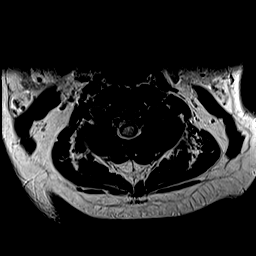

[Series 27: T1 · axial · non-contrast · 3.0mm · 0.35mm/px · z∈[-239,-138]mm · 8 of 30 slices shown]
[im 1/30]
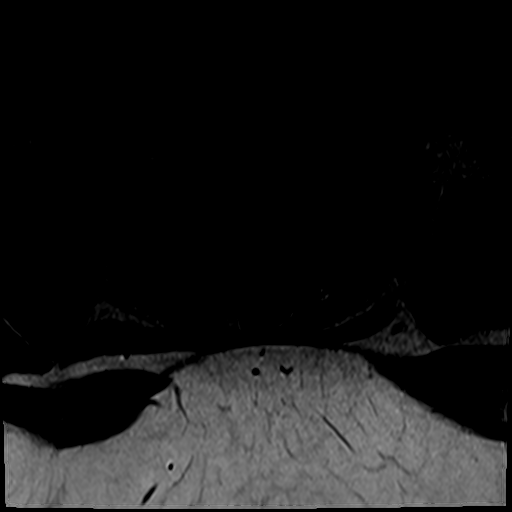
[im 5/30]
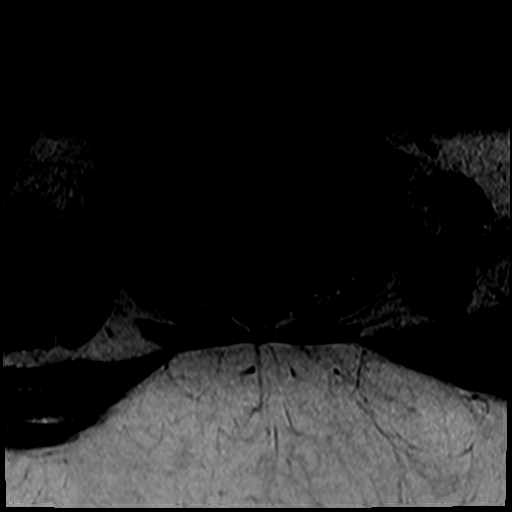
[im 9/30]
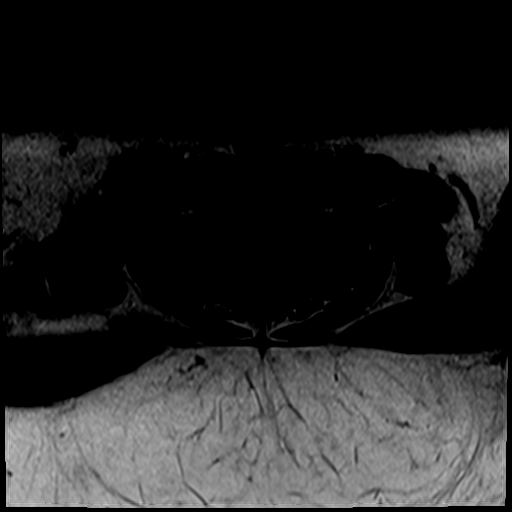
[im 13/30]
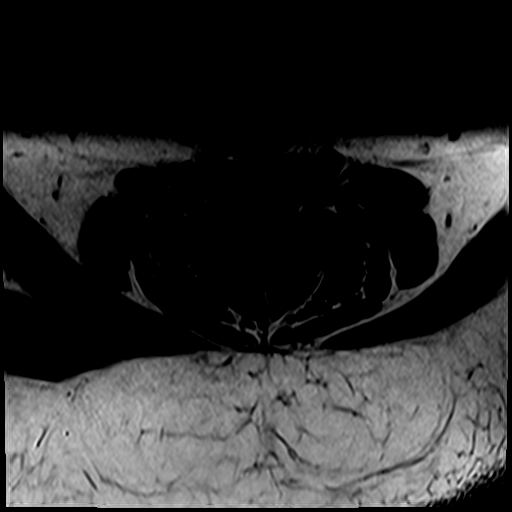
[im 17/30]
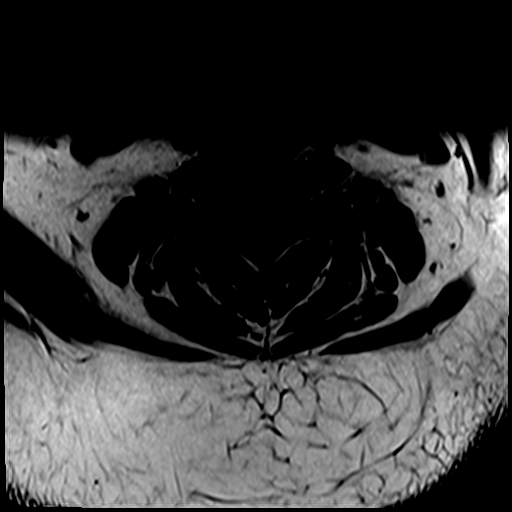
[im 21/30]
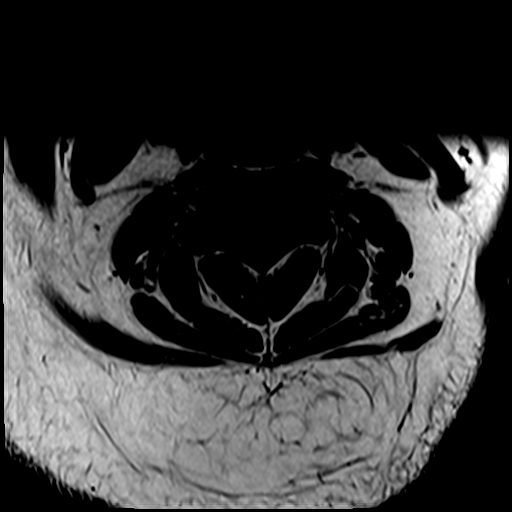
[im 25/30]
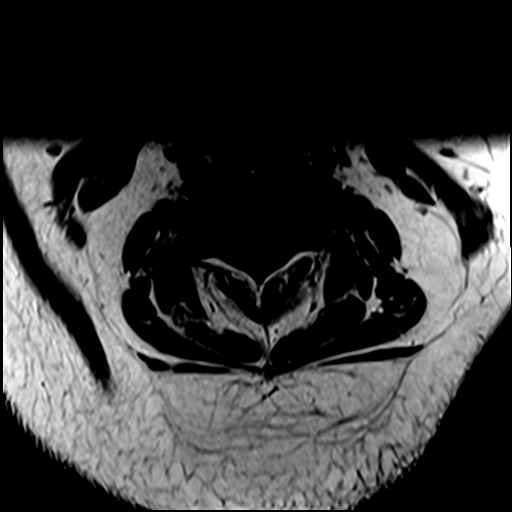
[im 30/30]
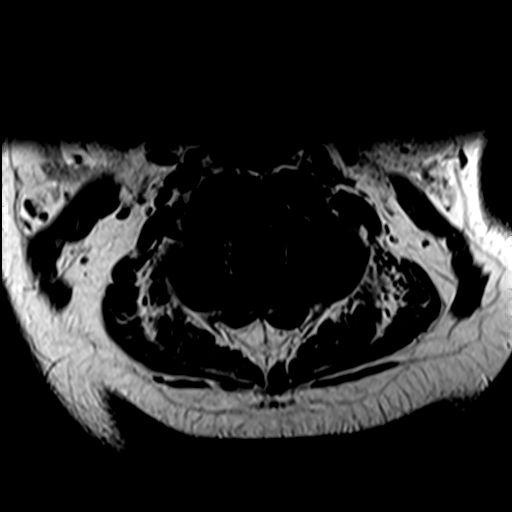

[Series 33: T1 post-contrast · axial · 3.0mm · 0.35mm/px · z∈[-239,-197]mm · 4 of 30 slices shown]
[im 1/30]
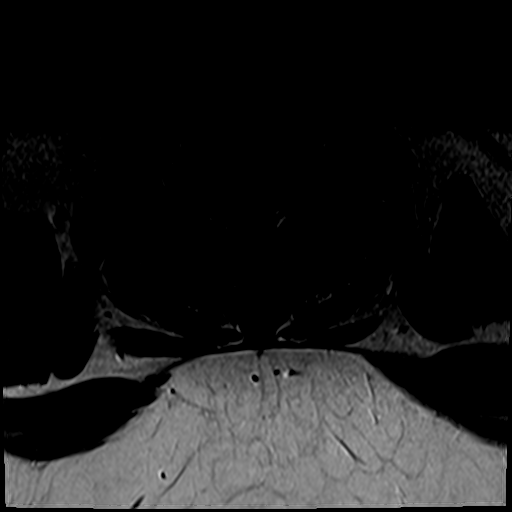
[im 5/30]
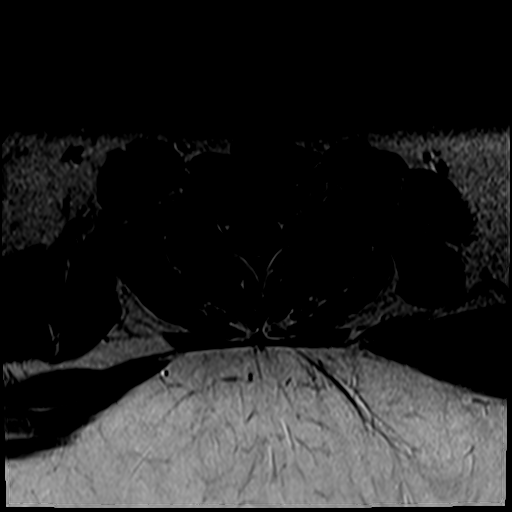
[im 9/30]
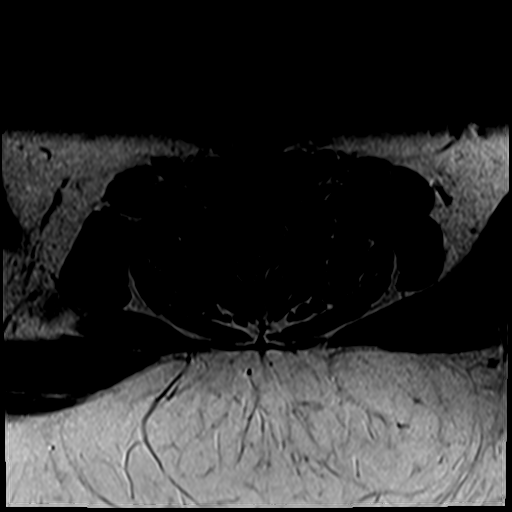
[im 13/30]
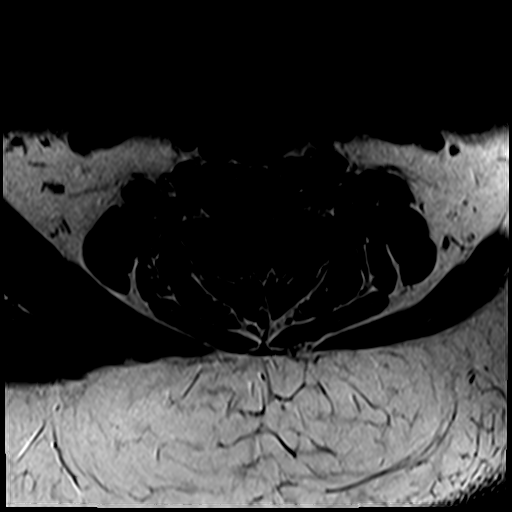

[28 of 48 positions shown; findings below may reference images not displayed]

FINDINGS: MRI HEAD FINDINGS

Brain: Ventricle size normal. Cerebral white matter normal. Negative
for acute infarct. No areas of hemorrhage or fluid collection

Mass lesion involving the medulla and upper cervical spinal cord.
The mass is expansile and appears intramedullary but does protrude
into the lower aspect of the fourth ventricle on the right. The mass
shows mild patchy enhancement. No blood products or cystic change.
No obstructive hydrocephalus.

Vascular: Normal arterial flow voids.

Skull and upper cervical spine: Negative

Sinuses/Orbits: Mild mucosal edema paranasal sinuses. Negative orbit

Other: None

MRI CERVICAL SPINE FINDINGS

Alignment: Normal

Vertebrae: Normal bone marrow.  Negative for fracture or mass

Cord: Expansile intramedullary mass centered at the craniocervical
junction. The mass has increased signal on T2. The mass infiltrates
the medulla, right greater than left. The mass appears to protrude
into the base of the fourth ventricle on the right. The mass
measures approximately 7 cm in length and shows mild enhancement
especially in the posterior medulla region. No syrinx. No evidence
of blood products.

Posterior Fossa, vertebral arteries, paraspinal tissues: Expansile
intramedullary mass lesion centered at the craniocervical junction
as described above.

No paraspinous mass or adenopathy in the neck.

Disc levels: Disc spaces are normal. No disc degeneration, disc
protrusion, or spinal stenosis.
IMPRESSION: 1. Large intramedullary expansile mass centered at the cranial
cervical junction. There is extension into the medulla and into the
upper cervical spinal cord. No hemorrhage or cystic change. There is
mild patchy enhancement especially in the posterior medulla
component of the mass. Findings most compatible with primary neural
neoplasm such as brainstem glioma or astrocytoma.
2. Negative for hydrocephalus.
3. These results were called by telephone at the time of
interpretation on [DATE] at [DATE] to provider KAKI ,
who verbally acknowledged these results.

## 2020-12-02 MED ORDER — BUTALBITAL-APAP-CAFFEINE 50-325-40 MG PO TABS: 1.0000 | ORAL_TABLET | Freq: Four times a day (QID) | ORAL | 0 refills | Status: AC | PRN

## 2020-12-02 MED ORDER — DIPHENHYDRAMINE HCL 50 MG/ML IJ SOLN
25.0000 mg | Freq: Once | INTRAMUSCULAR | Status: AC
  Administered 2020-12-02: 25 mg via INTRAVENOUS
  Filled 2020-12-02: qty 1

## 2020-12-02 MED ORDER — GADOBUTROL 1 MMOL/ML IV SOLN
10.0000 mL | Freq: Once | INTRAVENOUS | Status: AC | PRN
  Administered 2020-12-02: 10 mL via INTRAVENOUS
  Filled 2020-12-02: qty 10

## 2020-12-02 MED ORDER — SODIUM CHLORIDE 0.9 % IV BOLUS
1000.0000 mL | Freq: Once | INTRAVENOUS | Status: AC
  Administered 2020-12-02: 1000 mL via INTRAVENOUS

## 2020-12-02 MED ORDER — METOCLOPRAMIDE HCL 5 MG/ML IJ SOLN
10.0000 mg | Freq: Once | INTRAMUSCULAR | Status: AC
  Administered 2020-12-02: 10 mg via INTRAVENOUS
  Filled 2020-12-02: qty 2

## 2020-12-02 MED ORDER — DEXAMETHASONE 6 MG PO TABS
10.0000 mg | ORAL_TABLET | Freq: Once | ORAL | Status: AC
  Administered 2020-12-02: 10 mg via ORAL
  Filled 2020-12-02: qty 1

## 2020-12-02 NOTE — Discharge Instructions (Signed)
Take over-the-counter Tylenol or Advil for moderate pain.  You can take the Fioricet for severe headache.  Call Neurosurgery to setup and confirm your appointment

## 2020-12-02 NOTE — ED Notes (Signed)
Patient transported to MRI 

## 2020-12-02 NOTE — ED Notes (Signed)
Pt reports that his headache is better. Awaiting him to go to MRI. Will continue to monitor.

## 2020-12-02 NOTE — ED Provider Notes (Signed)
[facility] Emergency Department Provider Note  ____________________________________________   Event Date/Time   First MD Initiated Contact with Patient 12/02/20 [phone]     (approximate)  I have reviewed the triage vital signs and the nursing notes.   HISTORY  Chief Complaint Headache    HPI Anthoni Archer is a 20 y.o. male presents emergency department with new onset of migraines after having covid and taking Paxlovid.  Patient states he did not used to have headaches.  Now he has sensitivity to light.  States he works at [employer] and is constantly checking lights which causes him to have more headaches.  States I just want to know what is wrong.  No vomiting today but usually does get vomiting.  No fever or chills.  Past Medical History:  Diagnosis Date   Hyperlipidemia    Hypertension    Obesity     There are no problems to display for this patient.   History reviewed. No pertinent surgical history.  Prior to Admission medications   Medication Sig Start Date End Date Taking? Authorizing Provider  butalbital-acetaminophen-caffeine (FIORICET) 50-325-40 MG tablet Take 1 tablet by mouth every 6 (six) hours as needed for headache. 12/02/20 12/02/21 Yes Duffy Bruce, MD  cetirizine (ZYRTEC) 10 MG tablet Take by mouth. 10/05/17 10/05/18  [provider]  enalapril (VASOTEC) 20 MG tablet Take by mouth. 10/05/17 10/17/18  [provider]  fluconazole (DIFLUCAN) 150 MG tablet Take 1 tablet (150 mg total) by mouth once a week. 11/16/20   Delman Kitten, MD  ondansetron (ZOFRAN) 4 MG tablet Take 1 tablet (4 mg total) by mouth every 8 (eight) hours as needed for up to 10 doses for nausea or vomiting. 09/27/20   Lucrezia Starch, MD    Allergies Bee pollen and Fluticasone propionate  History reviewed. No pertinent family history.  Social History Social History   Tobacco Use   Smoking status: Never   Smokeless tobacco: Never  Vaping  Use   Vaping Use: Every day  Substance Use Topics   Alcohol use: Not Currently   Drug use: Not Currently    Review of Systems  Constitutional: No fever/chills Eyes: No visual changes. ENT: No sore throat. Respiratory: Denies cough Cardiovascular: Denies chest pain Gastrointestinal: Denies abdominal pain Genitourinary: Negative for dysuria. Musculoskeletal: Negative for back pain. Skin: Negative for rash. Psychiatric: no mood changes,     ____________________________________________   PHYSICAL EXAM:  VITAL SIGNS: ED Triage Vitals  Enc Vitals Group     BP 12/02/20 0722 114/60     Pulse Rate 12/02/20 0722 97     Resp 12/02/20 0722 20     Temp 12/02/20 0722 98.1 F (36.7 C)     Temp Source 12/02/20 0722 Oral     SpO2 12/02/20 0722 98 %     Weight 12/02/20 0721 (!) 379 lb 13.6 oz (172.3 kg)     Height 12/02/20 0721 '5\' 10"'$  (1.778 m)     Head Circumference --      Peak Flow --      Pain Score 12/02/20 0720 6     Pain Loc --      Pain Edu? --      Excl. in [place]? --     Constitutional: Alert and oriented. Well appearing and in no acute distress. Eyes: Conjunctivae are normal.  PERRL, 3 beats nystagmus bilaterally Head: Atraumatic. Nose: No congestion/rhinnorhea. Mouth/Throat: Mucous membranes are moist.   Neck:  supple no lymphadenopathy noted  Cardiovascular: Normal rate, regular rhythm. Heart sounds are normal Respiratory: Normal respiratory effort.  No retractions, lungs c t a  GU: deferred Musculoskeletal: FROM all extremities, warm and well perfused Neurologic:  Normal speech and language.  Skin:  Skin is warm, dry and intact. No rash noted. Psychiatric: Mood and affect are normal. Speech and behavior are normal.  ____________________________________________   LABS (all labs ordered are listed, but only abnormal results are displayed)  Labs Reviewed  COMPREHENSIVE METABOLIC PANEL - Abnormal; Notable for the following components:      Result Value   Sodium  134 (*)    Chloride 95 (*)    Glucose, Bld 103 (*)    All other components within normal limits  D-DIMER, QUANTITATIVE  CBC WITH DIFFERENTIAL/PLATELET   ____________________________________________   ____________________________________________  RADIOLOGY  MRI of the brain  ____________________________________________   PROCEDURES  Procedure(s) performed: No  Procedures    ____________________________________________   INITIAL IMPRESSION / ASSESSMENT AND PLAN / ED COURSE  Pertinent labs & imaging results that were available during my care of the patient were reviewed by me and considered in my medical decision making (see chart for details).   Patient is a 20 year old male presents with new onset of migraines following covid and Paxlovid use.  See HPI.  Physical exam shows patient appears stable  DDx: New onset migraine, SAH, subdural, CVA  Labs and imaging ordered  Labs are reassuring, CBC, metabolic panel and D-dimer are all normal  MRI of the brain with and without contrast, had a phone call from radiology asking me to add contrast and a C-spine with and without contrast  Spoke with radiologist concerning the MRI.  Patient has a brainstem tumor.  No hydrocephalus at this time.  States this is a hard area to treat and we need to consult neurosurgery.  Spoke with Dr. Ellender Hose concerning the MRI results.  Due to the nature of the results Dr Ellender Hose will be assuming care of the patient.    [name] was evaluated in Emergency Department on 12/02/2020 for the symptoms described in the history of present illness. He was evaluated in the context of the global COVID-19 pandemic, which necessitated consideration that the patient might be at risk for infection with the SARS-CoV-2 virus that causes COVID-19. Institutional protocols and algorithms that pertain to the evaluation of patients at risk for COVID-19 are in a state of rapid change based on information released  by regulatory bodies including the CDC and federal and state organizations. These policies and algorithms were followed during the patient's care in the ED.    As part of my medical decision making, I reviewed the following data within the [place] notes reviewed and incorporated, Labs reviewed , Old chart reviewed, Patient signed out to dr Ellender Hose, Radiograph reviewed , Discussed with radiologist, A consult was requested and obtained from this/these consultant(s) Neurosurgery, Evaluated by EM attending , Notes from prior ED visits, and [place] Controlled Substance Database  ____________________________________________   FINAL CLINICAL IMPRESSION(S) / ED DIAGNOSES  Final diagnoses:  Brain mass  Acute nonintractable headache, unspecified headache type      NEW MEDICATIONS STARTED DURING THIS VISIT:  New Prescriptions   BUTALBITAL-ACETAMINOPHEN-CAFFEINE (FIORICET) 50-325-40 MG TABLET    Take 1 tablet by mouth every 6 (six) hours as needed for headache.     Note:  This document was prepared using Dragon voice recognition software and may include unintentional dictation errors.    Versie Starks,  PA-C 12/02/20 1342    Duffy Bruce, MD 12/07/20 [phone]

## 2020-12-02 NOTE — ED Triage Notes (Signed)
Pt comes into the ED via POV c/o headaches that have been intermittent since he had COVID 2 months ago.  Pt neurologically intact at this time and is in NAD.

## 2020-12-02 NOTE — Consult Note (Signed)
Referring Physician:  No referring provider defined for this encounter.  Primary Physician:  Raymond Late, MD  Chief Complaint:  headche  History of Present Illness: 12/02/2020 Raymond Archer is a 20 y.o. male who presents with the chief complaint of headache for the last couple of months.  These are new, and involve light sensitivity. He has headaches in the front and back of his head. He occasionally has neck pain.    He denies imbalance, swallowing difficulty, voice changes, or any other neurologic symptoms.  Review of Systems:  A 10 point review of systems is negative, except for the pertinent positives and negatives detailed in the HPI.  Past Medical History: Past Medical History:  Diagnosis Date   Hyperlipidemia    Hypertension    Obesity     Past Surgical History: History reviewed. No pertinent surgical history.  Allergies: Allergies as of 12/02/2020 - Review Complete 12/02/2020  Allergen Reaction Noted   Bee pollen  11/16/2020   Fluticasone propionate Other (See Comments) 12/01/2014    Medications: No current facility-administered medications for this encounter.  Current Outpatient Medications:    cetirizine (ZYRTEC) 10 MG tablet, Take by mouth., Disp: , Rfl:    enalapril (VASOTEC) 20 MG tablet, Take by mouth., Disp: , Rfl:    fluconazole (DIFLUCAN) 150 MG tablet, Take 1 tablet (150 mg total) by mouth once a week., Disp: 4 tablet, Rfl: 0   ondansetron (ZOFRAN) 4 MG tablet, Take 1 tablet (4 mg total) by mouth every 8 (eight) hours as needed for up to 10 doses for nausea or vomiting., Disp: 10 tablet, Rfl: 0   Social History: Social History   Tobacco Use   Smoking status: Never   Smokeless tobacco: Never  Vaping Use   Vaping Use: Every day  Substance Use Topics   Alcohol use: Not Currently   Drug use: Not Currently    Family Medical History: History reviewed. No pertinent family history.  Physical Examination: Vitals:   12/02/20 0722  BP:  114/60  Pulse: 97  Resp: 20  Temp: 98.1 F (36.7 C)  SpO2: 98%     General: Patient is well developed, well nourished, calm, collected, and in no apparent distress.  Psychiatric: Patient is non-anxious.  Head:  Pupils equal, round, and reactive to light.  ENT:  Oral mucosa appears well hydrated.  Neck:   Supple.  Full range of motion.  Respiratory: Patient is breathing without any difficulty.  Extremities: No edema.  Vascular: Palpable pulses in dorsal pedal vessels.  Skin:   On exposed skin, there are no abnormal skin lesions.  NEUROLOGICAL:  General: In no acute distress.   Awake, alert, oriented to person, place, and time.  Pupils equal round and reactive to light.  Facial tone is symmetric.  Tongue protrusion is midline.  Palatal elevation is symmetric. There is no pronator drift.  ROM of spine: full.  Strength: Side Biceps Triceps Deltoid Interossei Grip Wrist Ext. Wrist Flex.  R '5 5 5 5 5 5 5  '$ L '5 5 5 5 5 5 5   '$ Side Iliopsoas Quads Hamstring PF DF EHL  R '5 5 5 5 5 5  '$ L '5 5 5 5 5 5    '$ Bilateral upper and lower extremity sensation is intact to light touch. Reflexes are 1+ and symmetric at the biceps, triceps, brachioradialis, patella and achilles. Hoffman's is absent.  Clonus is not present.   Gait is normal.  No difficulty with tandem gait.  Imaging: MRI Brain 12/02/20 IMPRESSION: 1. 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 There is extension into the medulla and into the upper cervical spinal cord. No hemorrhage or cystic change. There is mild patchy enhancement especially in the posterior medulla component of the mass. 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 2. Negative for hydrocephalus. 3. These results were called by telephone at the time of interpretation on 12/02/2020 at 11:15 am to provider Raymond Archer , who verbally acknowledged these results.      Electronically Signed   By: Raymond Archer M.D.   On: 12/02/2020 11:16  I have personally reviewed the images and agree with the above interpretation.  Labs: CBC Latest Ref Rng & Units 12/02/2020 11/16/2020 09/27/2020  WBC 4.0 - 10.5 K/uL 9.5 9.1 5.3  Hemoglobin 13.0 - 17.0 g/dL 14.9 14.2 15.4  Hematocrit 39.0 - 52.0 % 44.4 41.1 46.2  Platelets 150 - 400 K/uL 289 316 221       Assessment and Plan: Mr. Raymond Archer is a pleasant 20 y.o. 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    - symptomatic treatment for headaches - will arrange outpatient follow-up with my partner Dr. Lacinda Archer - stay out of work for now   [employer]. Raymond Ribas MD, [facility]

## 2020-12-02 NOTE — ED Notes (Signed)
Neurologist at bedside. 

## 2020-12-24 ENCOUNTER — Other Ambulatory Visit: Payer: Self-pay | Admitting: Neurosurgery

## 2020-12-24 DIAGNOSIS — D499 Neoplasm of unspecified behavior of unspecified site: Secondary | ICD-10-CM

## 2021-01-07 ENCOUNTER — Other Ambulatory Visit: Payer: Self-pay

## 2021-01-07 ENCOUNTER — Ambulatory Visit
Admission: RE | Admit: 2021-01-07 | Discharge: 2021-01-07 | Disposition: A | Discharge status: Home / Self Care | Payer: Medicaid Other | Source: Ambulatory Visit | Attending: Neurosurgery | Admitting: Neurosurgery

## 2021-01-07 DIAGNOSIS — D499 Neoplasm of unspecified behavior of unspecified site: Secondary | ICD-10-CM

## 2021-01-07 IMAGING — MR MR THORACIC SPINE WO/W CM
6 of 9 series · 28 of 48 positions shown · IV contrast (10ml Gadavist)
Comparison: Head and cervical spine MRI [DATE]

CLINICAL DATA: Neoplasm at the craniocervical junction.

EXAM:
MRI THORACIC AND LUMBAR SPINE WITHOUT AND WITH CONTRAST
TECHNIQUE: Multiplanar and multiecho pulse sequences of the thoracic and lumbar
spine were obtained without and with intravenous contrast.
CONTRAST:  10mL GADAVIST GADOBUTROL 1 MMOL/ML IV SOLN

[Series 5: T1 · sagittal · 5.0mm · 1.41mm/px · 1 of 9 slices shown (1 of 3)]
[im 1/9]
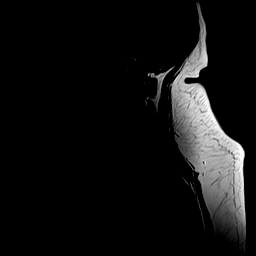

[Series 12: T2 · sagittal · 3.0mm · 1.06mm/px · 3 of 19 slices shown (1 of 2)]
[im 1/19]
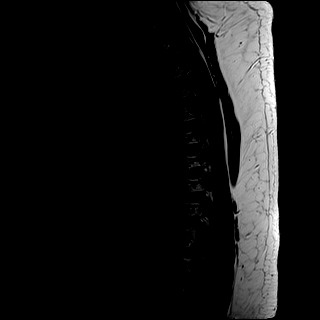
[im 10/19]
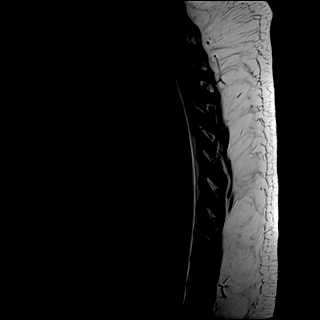
[im 19/19]
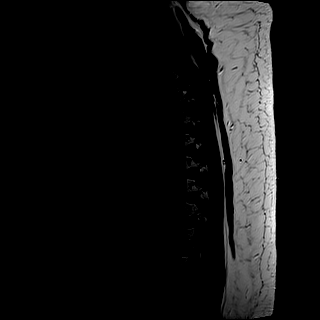

[Series 13: T1 · sagittal · 3.0mm · 1.06mm/px · 4 of 19 slices shown (2 of 3)]
[im 1/19]
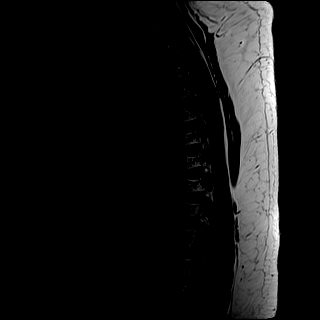
[im 7/19]
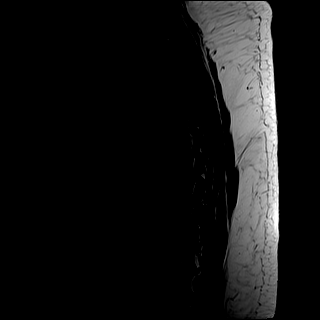
[im 13/19]
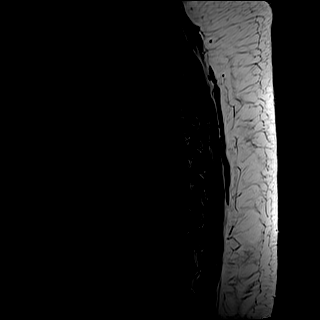
[im 19/19]
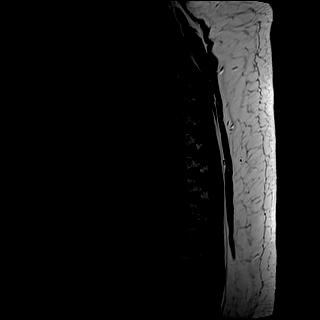

[Series 15: T2 · axial · 4.0mm · 0.59mm/px · z∈[+91,+357]mm · 8 of 39 slices shown (2 of 2)]
[im 1/39]
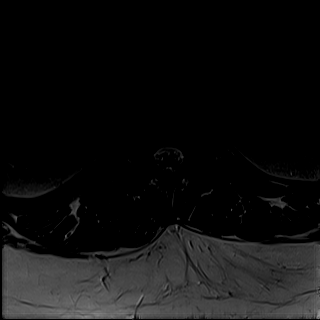
[im 6/39]
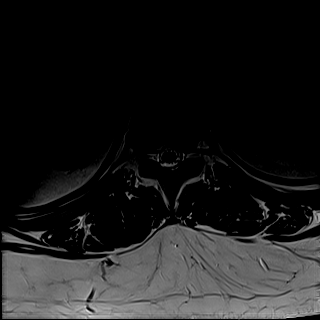
[im 11/39]
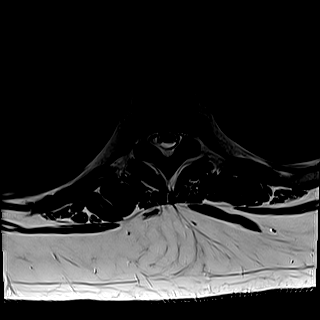
[im 17/39]
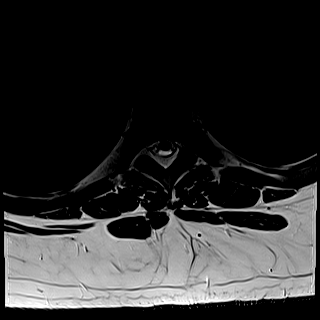
[im 22/39]
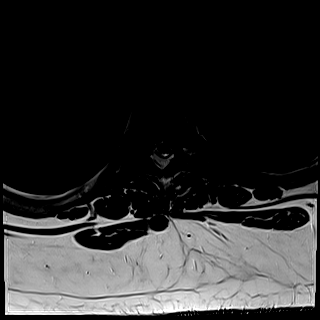
[im 28/39]
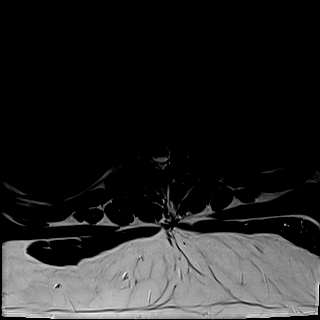
[im 33/39]
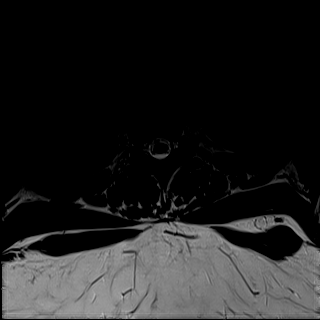
[im 39/39]
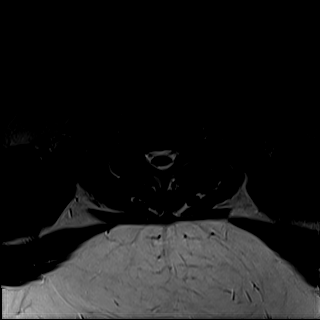

[Series 17: T1 · axial · non-contrast · 4.0mm · 0.31mm/px · z∈[+91,+357]mm · 8 of 39 slices shown (3 of 3)]
[im 1/39]
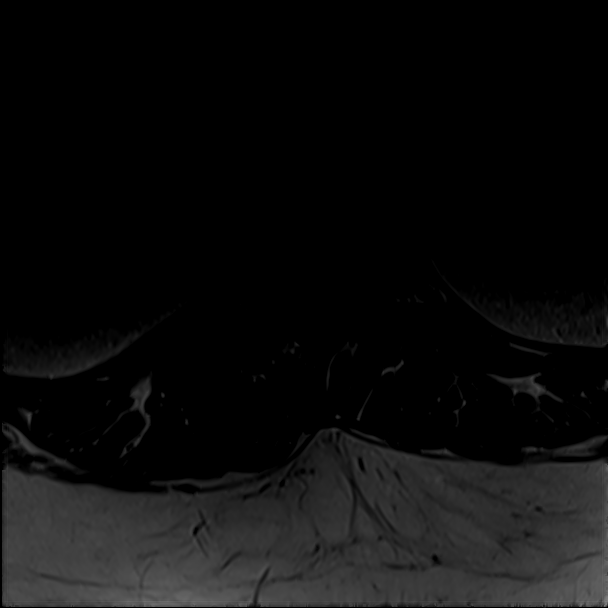
[im 6/39]
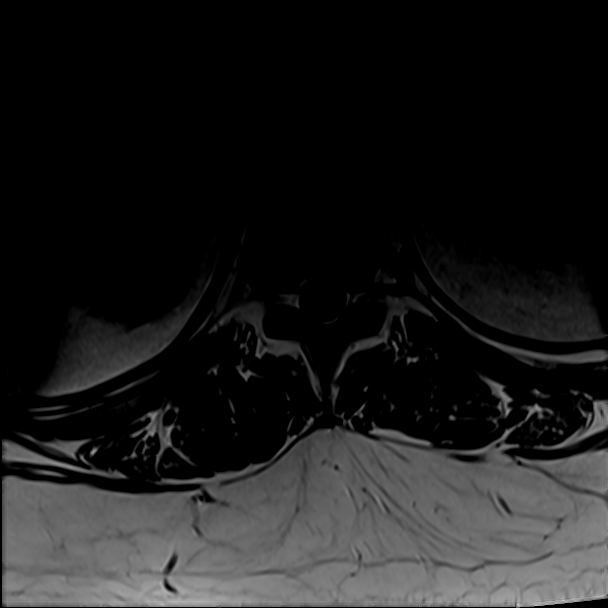
[im 11/39]
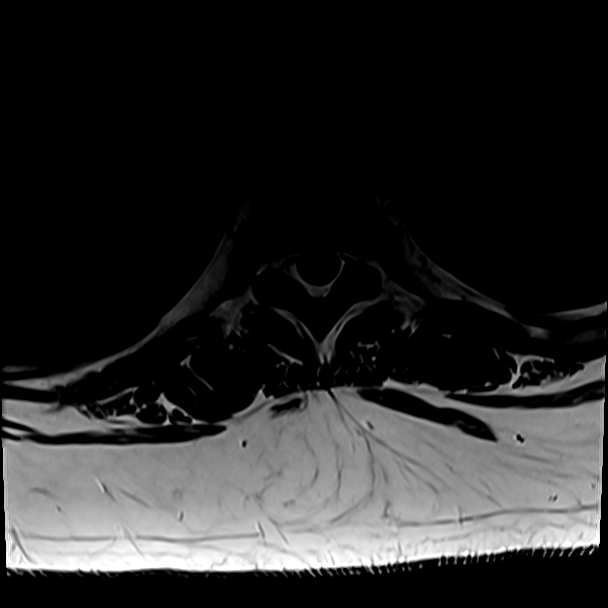
[im 17/39]
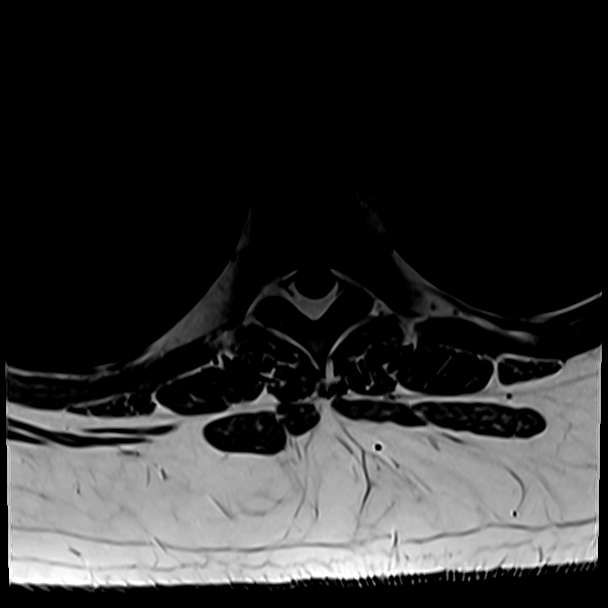
[im 22/39]
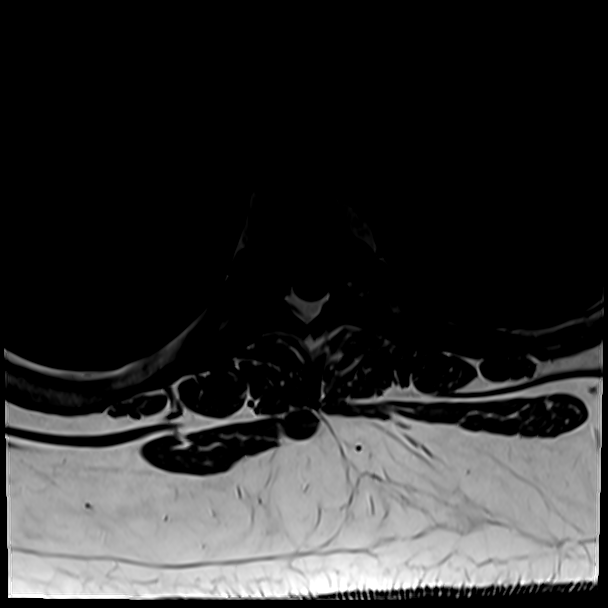
[im 28/39]
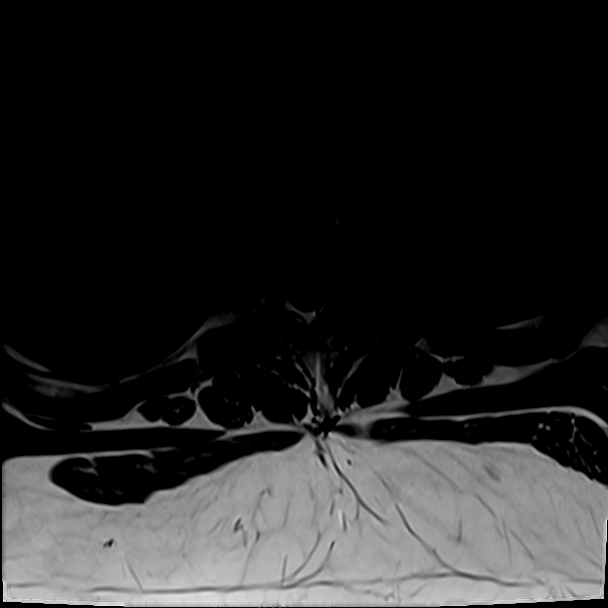
[im 33/39]
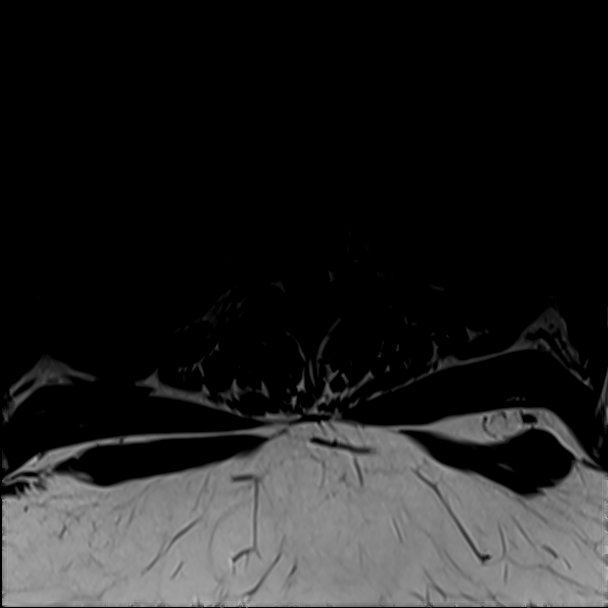
[im 39/39]
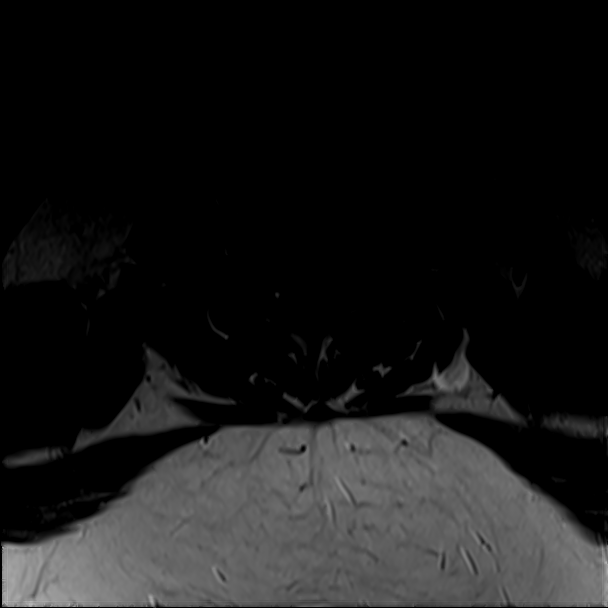

[Series 19: T1 fat-sat post-contrast · sagittal · 3.0mm · 1.06mm/px · 4 of 19 slices shown]
[im 1/19]
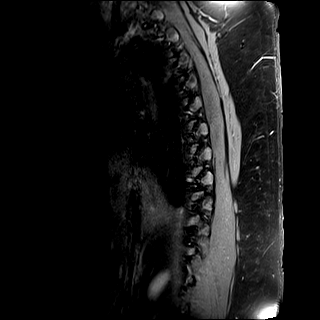
[im 7/19]
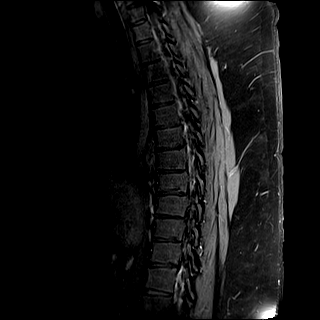
[im 13/19]
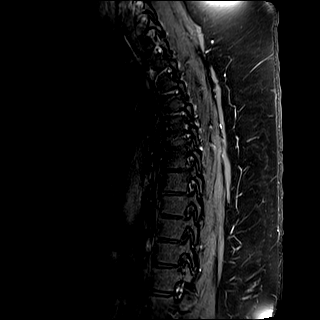
[im 19/19]
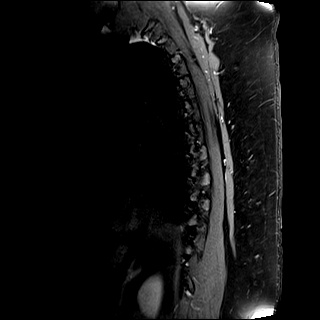

[28 of 48 positions shown; findings below may reference images not displayed]

FINDINGS: MRI THORACIC SPINE FINDINGS

Alignment:  Normal.

Vertebrae: No fracture, suspicious marrow lesion, or significant
marrow edema.

Cord: Normal cord signal and morphology. No abnormal intradural
enhancement.

Paraspinal and other soft tissues: Subcentimeter cyst in the upper
pole of the right kidney.

Disc levels:

Prominent dorsal epidural fat partially effacing the thecal sac in
the midthoracic spine. Small right paracentral disc protrusions at
T5-6, T6-7, T10-11, and T11-12 and small central disc protrusions at
T7-8 and T9-10 without compressive stenosis.

MRI LUMBAR SPINE FINDINGS

Segmentation:  Standard.

Alignment:  Normal.

Vertebrae: No fracture, suspicious marrow lesion, or significant
marrow edema.

Conus medullaris: Extends to the L1-2 level and appears normal.
Normal appearance of the cauda equina. No abnormal intradural
enhancement.

Paraspinal and other soft tissues: Unremarkable.

Disc levels:

Preserved disc height and hydration throughout the lumbar spine
without a significant disc herniation or stenosis. Slight left facet
hypertrophy at L5-S1 with a 7 mm synovial cyst along the
posterosuperior aspect of the joint, not in a position to cause
neural impingement.
IMPRESSION: 1. No evidence of metastatic disease in the thoracic or lumbar
spine.
2. Small thoracic disc protrusions without compressive stenosis.

## 2021-01-07 IMAGING — MR MR LUMBAR SPINE WO/W CM
6 of 7 series · 31 of 48 positions shown · IV contrast (gadavist)
Comparison: Head and cervical spine MRI [DATE]

CLINICAL DATA: Neoplasm at the craniocervical junction.

EXAM:
MRI THORACIC AND LUMBAR SPINE WITHOUT AND WITH CONTRAST
TECHNIQUE: Multiplanar and multiecho pulse sequences of the thoracic and lumbar
spine were obtained without and with intravenous contrast.
CONTRAST:  10mL GADAVIST GADOBUTROL 1 MMOL/ML IV SOLN

[Series 5: T2 · sagittal · 4.0mm · 0.81mm/px · 5 of 15 slices shown (1 of 2)]
[im 1/15]
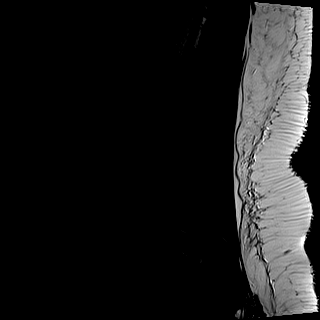
[im 4/15]
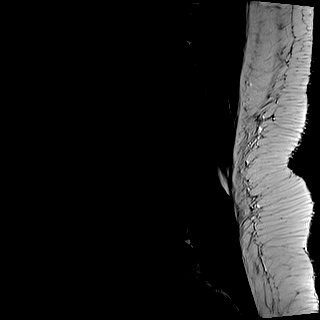
[im 8/15]
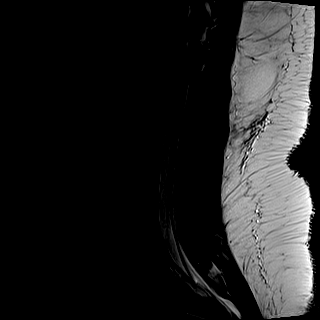
[im 11/15]
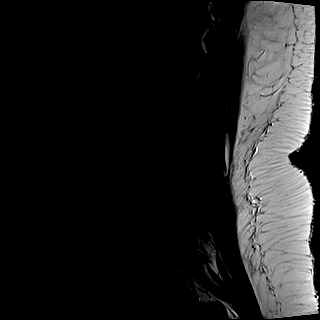
[im 15/15]
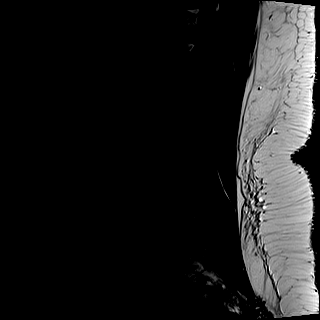

[Series 6: T1 · sagittal · 4.0mm · 0.81mm/px · 5 of 15 slices shown (1 of 2)]
[im 1/15]
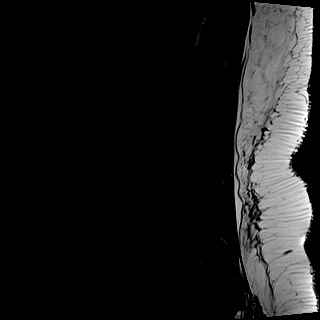
[im 4/15]
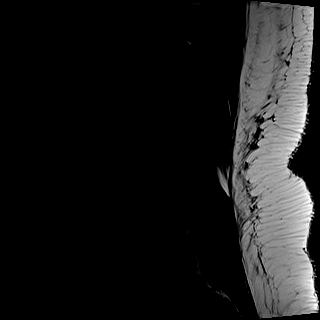
[im 8/15]
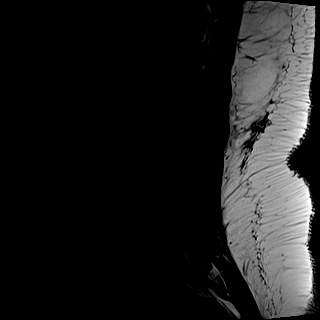
[im 11/15]
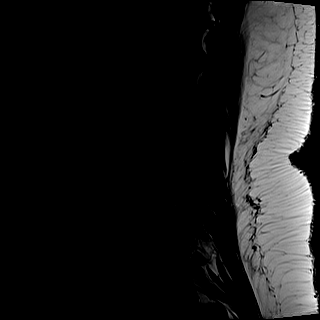
[im 15/15]
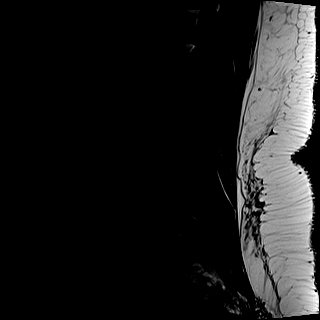

[Series 7: STIR · sagittal · 4.0mm · 0.41mm/px · 1 of 15 slices shown]
[im 1/15]
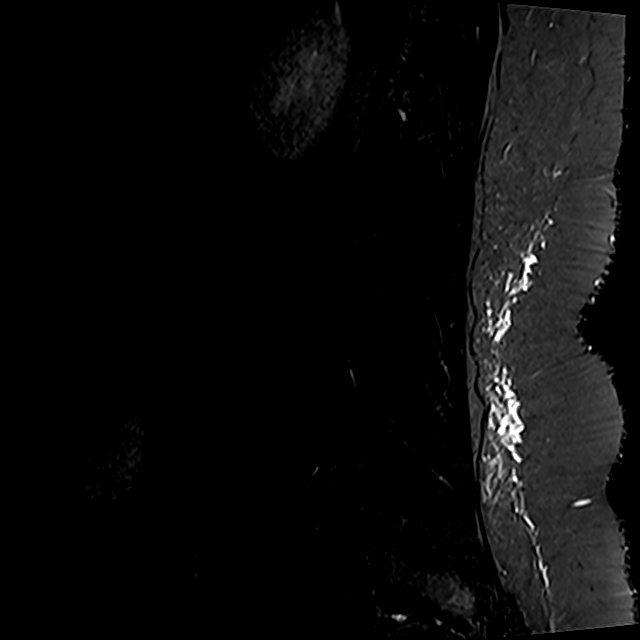

[Series 8: T2 · axial · 4.0mm · 0.78mm/px · z∈[-122,+106]mm · 8 of 38 slices shown (2 of 2)]
[im 1/38]
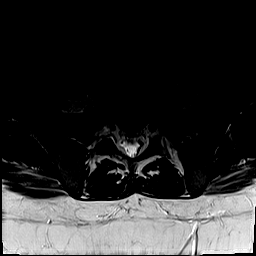
[im 5/38]
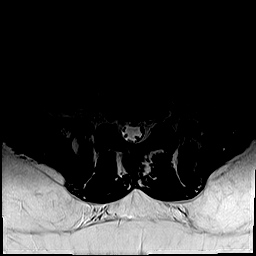
[im 13/38]
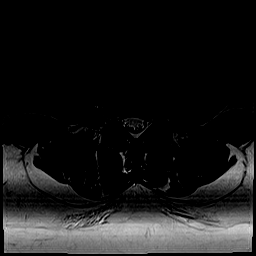
[im 17/38]
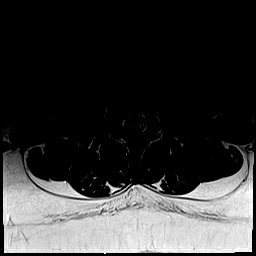
[im 21/38]
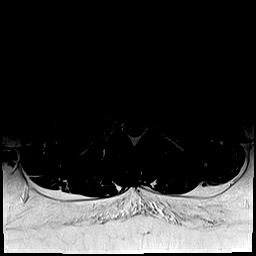
[im 25/38]
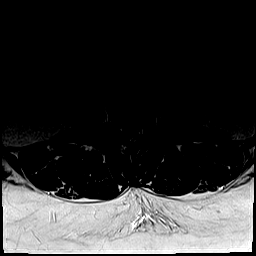
[im 33/38]
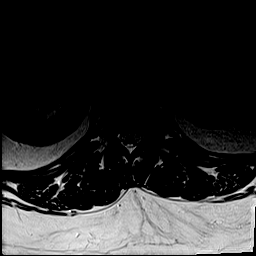
[im 38/38]
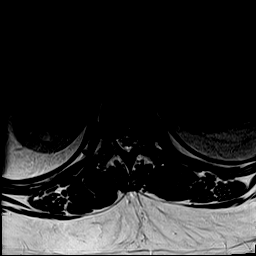

[Series 9: T1 · axial · 4.0mm · 0.39mm/px · z∈[-122,+106]mm · 8 of 38 slices shown (2 of 2)]
[im 1/38]
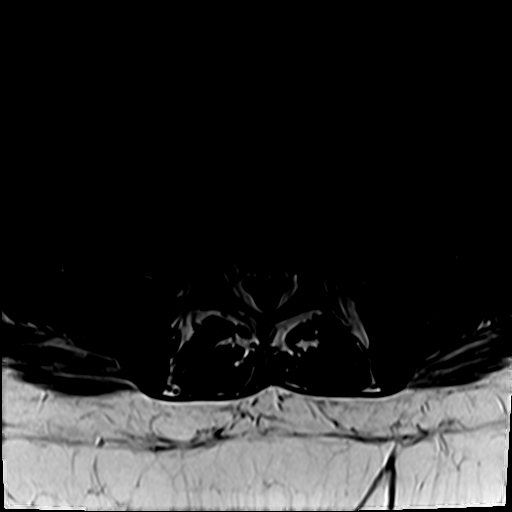
[im 5/38]
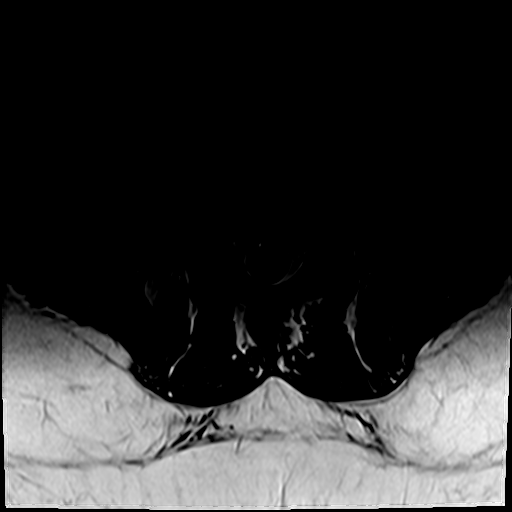
[im 13/38]
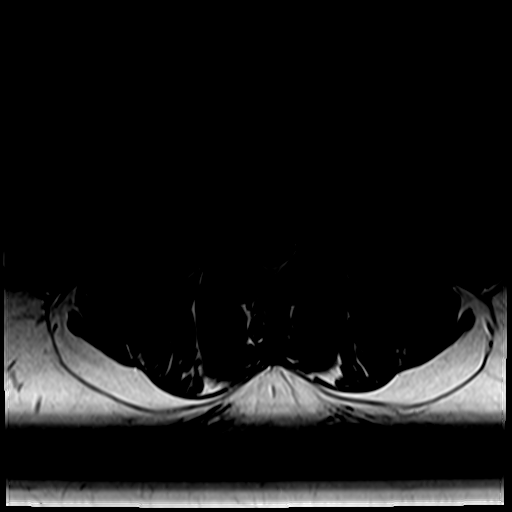
[im 17/38]
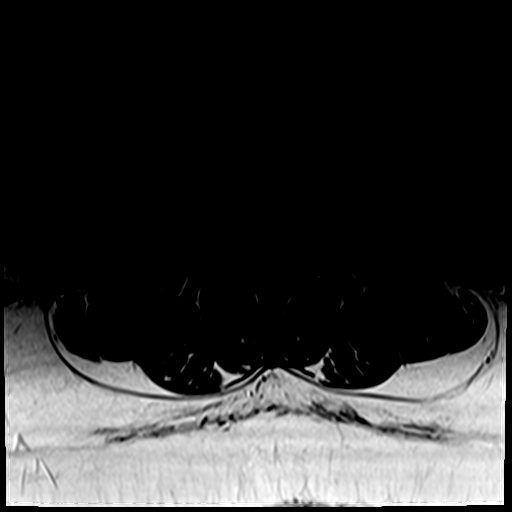
[im 21/38]
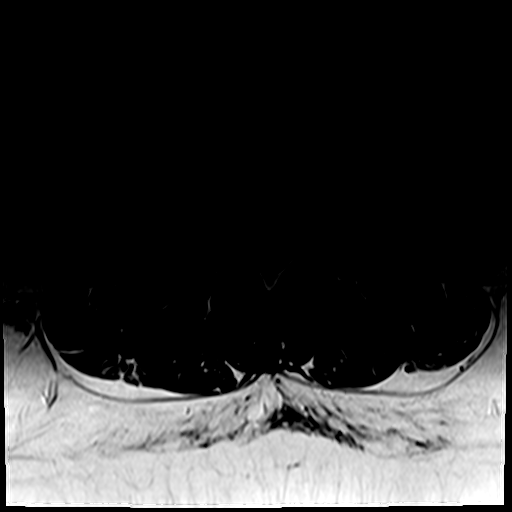
[im 25/38]
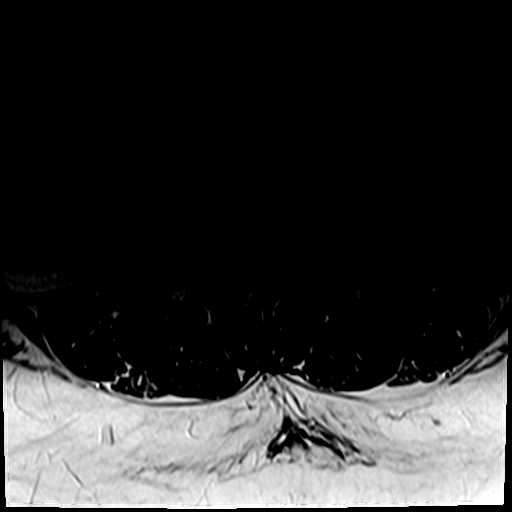
[im 33/38]
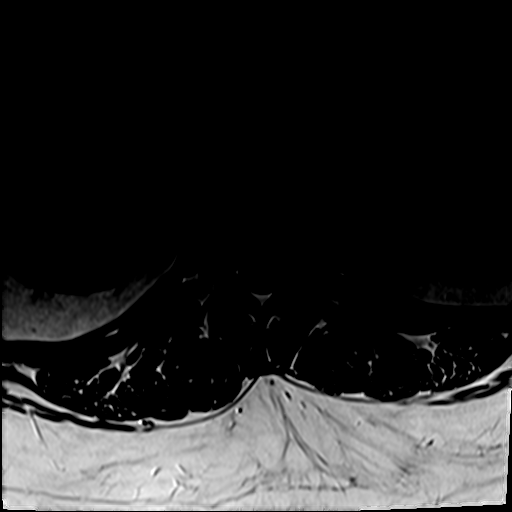
[im 38/38]
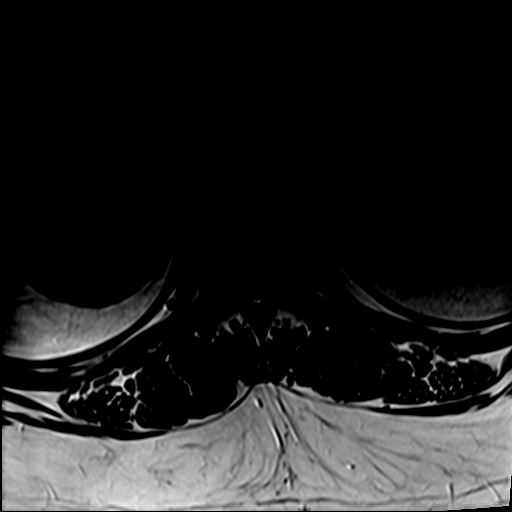

[Series 10: T1 fat-sat post-contrast · sagittal · 4.0mm · 0.81mm/px · 4 of 15 slices shown]
[im 1/15]
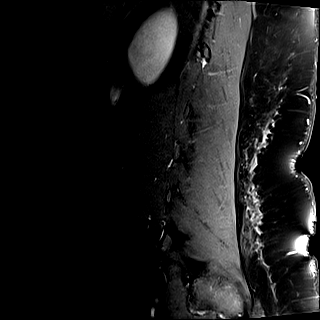
[im 5/15]
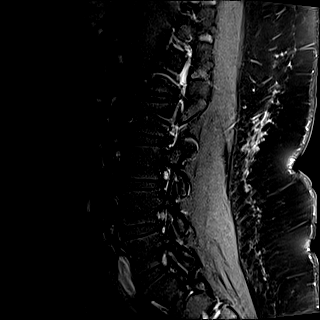
[im 10/15]
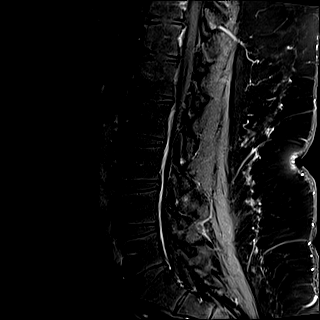
[im 15/15]
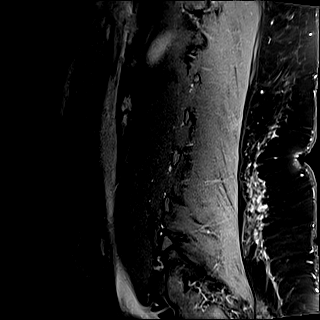

[31 of 48 positions shown; findings below may reference images not displayed]

FINDINGS: MRI THORACIC SPINE FINDINGS

Alignment:  Normal.

Vertebrae: No fracture, suspicious marrow lesion, or significant
marrow edema.

Cord: Normal cord signal and morphology. No abnormal intradural
enhancement.

Paraspinal and other soft tissues: Subcentimeter cyst in the upper
pole of the right kidney.

Disc levels:

Prominent dorsal epidural fat partially effacing the thecal sac in
the midthoracic spine. Small right paracentral disc protrusions at
T5-6, T6-7, T10-11, and T11-12 and small central disc protrusions at
T7-8 and T9-10 without compressive stenosis.

MRI LUMBAR SPINE FINDINGS

Segmentation:  Standard.

Alignment:  Normal.

Vertebrae: No fracture, suspicious marrow lesion, or significant
marrow edema.

Conus medullaris: Extends to the L1-2 level and appears normal.
Normal appearance of the cauda equina. No abnormal intradural
enhancement.

Paraspinal and other soft tissues: Unremarkable.

Disc levels:

Preserved disc height and hydration throughout the lumbar spine
without a significant disc herniation or stenosis. Slight left facet
hypertrophy at L5-S1 with a 7 mm synovial cyst along the
posterosuperior aspect of the joint, not in a position to cause
neural impingement.
IMPRESSION: 1. No evidence of metastatic disease in the thoracic or lumbar
spine.
2. Small thoracic disc protrusions without compressive stenosis.

## 2021-01-07 MED ORDER — GADOBUTROL 1 MMOL/ML IV SOLN
10.0000 mL | Freq: Once | INTRAVENOUS | Status: AC | PRN
  Administered 2021-01-07: 10 mL via INTRAVENOUS

## 2023-10-09 ENCOUNTER — Emergency Department
Admission: EM | Admit: 2023-10-09 | Discharge: 2023-10-10 | Disposition: A | Discharge status: Home / Self Care | Attending: Emergency Medicine | Admitting: Emergency Medicine

## 2023-10-09 ENCOUNTER — Other Ambulatory Visit: Payer: Self-pay

## 2023-10-09 ENCOUNTER — Emergency Department

## 2023-10-09 DIAGNOSIS — I10 Essential (primary) hypertension: Secondary | ICD-10-CM | POA: Diagnosis not present

## 2023-10-09 DIAGNOSIS — E876 Hypokalemia: Secondary | ICD-10-CM | POA: Diagnosis not present

## 2023-10-09 DIAGNOSIS — R531 Weakness: Secondary | ICD-10-CM | POA: Insufficient documentation

## 2023-10-09 LAB — DIFFERENTIAL
Abs Immature Granulocytes: 0.02 10*3/uL (ref 0.00–0.07)
Basophils Absolute: 0.1 10*3/uL (ref 0.0–0.1)
Basophils Relative: 1 %
Eosinophils Absolute: 0.1 10*3/uL (ref 0.0–0.5)
Eosinophils Relative: 1 %
Immature Granulocytes: 0 %
Lymphocytes Relative: 22 %
Lymphs Abs: 1.7 10*3/uL (ref 0.7–4.0)
Monocytes Absolute: 0.4 10*3/uL (ref 0.1–1.0)
Monocytes Relative: 5 %
Neutro Abs: 5.4 10*3/uL (ref 1.7–7.7)
Neutrophils Relative %: 71 %

## 2023-10-09 LAB — CBC
HCT: 43.2 % (ref 39.0–52.0)
Hemoglobin: 13.7 g/dL (ref 13.0–17.0)
MCH: 24.8 pg — ABNORMAL LOW (ref 26.0–34.0)
MCHC: 31.7 g/dL (ref 30.0–36.0)
MCV: 78.1 fL — ABNORMAL LOW (ref 80.0–100.0)
Platelets: 331 10*3/uL (ref 150–400)
RBC: 5.53 MIL/uL (ref 4.22–5.81)
RDW: 14.3 % (ref 11.5–15.5)
WBC: 7.6 10*3/uL (ref 4.0–10.5)
nRBC: 0 % (ref 0.0–0.2)

## 2023-10-09 LAB — ETHANOL: Alcohol, Ethyl (B): <15 mg/dL (ref <15)

## 2023-10-09 LAB — COMPREHENSIVE METABOLIC PANEL WITH GFR
ALT: 57 U/L — ABNORMAL HIGH (ref 0–44)
AST: 43 U/L — ABNORMAL HIGH (ref 15–41)
Albumin: 3.6 g/dL (ref 3.5–5.0)
Alkaline Phosphatase: 66 U/L (ref 38–126)
Anion gap: 10 (ref 5–15)
BUN: <5 mg/dL — ABNORMAL LOW (ref 6–20)
CO2: 27 mmol/L (ref 22–32)
Calcium: 9 mg/dL (ref 8.9–10.3)
Chloride: 100 mmol/L (ref 98–111)
Creatinine, Ser: 0.71 mg/dL (ref 0.61–1.24)
GFR, Estimated: >60 mL/min (ref >60)
Glucose, Bld: 144 mg/dL — ABNORMAL HIGH (ref 70–99)
Potassium: 3.4 mmol/L — ABNORMAL LOW (ref 3.5–5.1)
Sodium: 137 mmol/L (ref 135–145)
Total Bilirubin: 0.9 mg/dL (ref 0.0–1.2)
Total Protein: 7.7 g/dL (ref 6.5–8.1)

## 2023-10-09 LAB — PROTIME-INR
INR: 1.1 (ref 0.8–1.2)
Prothrombin Time: 14.1 s (ref 11.4–15.2)

## 2023-10-09 LAB — APTT: aPTT: 30 s (ref 24–36)

## 2023-10-09 MED ORDER — HYDROCHLOROTHIAZIDE 12.5 MG PO CAPS
12.5000 mg | ORAL_CAPSULE | Freq: Every day | ORAL | 11 refills | Status: AC
  Filled 2023-10-09: qty 30, 30d supply, fill #0

## 2023-10-09 MED ORDER — BENAZEPRIL HCL 20 MG PO TABS
20.0000 mg | ORAL_TABLET | Freq: Every day | ORAL | 11 refills | Status: AC
  Filled 2023-10-09: qty 30, 30d supply, fill #0

## 2023-10-09 MED ORDER — GADOBUTROL 1 MMOL/ML IV SOLN
10.0000 mL | Freq: Once | INTRAVENOUS | Status: AC | PRN
  Administered 2023-10-09: 10 mL via INTRAVENOUS

## 2023-10-09 MED ORDER — BACITRACIN 500 UNIT/GM EX OINT
1.0000 | TOPICAL_OINTMENT | Freq: Two times a day (BID) | CUTANEOUS | 2 refills | Status: AC
  Filled 2023-10-09: qty 28, 16d supply, fill #0

## 2023-10-09 NOTE — Discharge Instructions (Addendum)
Rent/Utility/Housing  Agency Name: [facility] Address: [address] [place], [place] [address] Phone: [phone] Email: [email or web address] Website: [email or web address] Service(s) Offered: Housing services, self-sufficiency, congregate meal program, weatherization program, Field seismologist program, emergency food assistance,  housing counseling, home ownership program, wheels -towork program.  Agency Name: [facility] Address: [address] [place], [place] [address] Phone: [phone] (8a-4p) [phone] (8p- 10p) Email: [email or web address] Website: [email or web address] Service(s) Offered: A program for homeless and/or needy men that includes one-on-one counseling, life skills training and job rehabilitation.  Agency Name: [facility] Address: [address], [place], [place] [address] Phone: [phone] Website: www.alliedchurches.org Service(s) Offered: Assistance to needy in emergency with utility bills, heating fuel, and prescriptions. Shelter for homeless 7pm-7am. August 17, 2016 15  Agency Name: [facility] [place] (Developmentally Disabled) Address: [address], [place], [place] [address] Phone: [phone] Contact Person: Cathleen Corti Email: [email or web address] Website: [email or web address] Service(s) Offered: Helps individuals with developmental disabilities move from housing that is more restrictive to homes where they  can achieve greater independence and have more  opportunities.  Agency Name: [facility] Address: [address], [place], [place] [address] Phone: [phone] Email: [email or web address] Website: www.[email or web address].org Service(s) Offered: Provides affordable housing for low-income families, elderly, and disabled individuals. Offer a wide range of  programs and services, from financial planning to  afterschool and summer programs.  Agency Name: [facility] Address: [address] [place], [place] [address] Phone: [phone] Service(s) Offered: Child support services; child welfare services; food stamps; Medicaid; work first family assistance; and aid with fuel,  rent, food and medicine.  Agency Name: [facility] [place] [employer]. Address: [facility] [address], [place], [place]  [address] Phone: [phone] Website: www.familyabuseservices.org Service(s) Offered: 24 hour Crisis Line: [phone]; 24 hour Emergency Shelter; Transitional Housing; Support Groups; Scientist, physiological; [employer]; Hispanic Outreach: [phone];  Visitation Center: [phone].  Agency Name: [facility] [place]. Address: [address], [place], [place] [address] Phone: [phone] Service(s) Offered: CAP Services; Home and [employer]; Individual or Group Supports; Respite Care Non-Institutional Nursing;  Residential Supports; Respite Care and Personal Care Services; Transportation; Family and Friends Night; Recreational Activities; Three Nutritious Meals/Snacks; Consultation with Registered Dietician; Twenty-four hour Registered Nurse Access; Daily and [employer]; Camp [employer] Leaves; [place] for the [employer] (During Summer Months) Bingo Night (Every  Wednesday Night); Special Populations Dance Night  (Every Tuesday Night); Professional Hair Care Services.  Agency Name: [facility] Address: [address], [place], [place] [address] Phone: [phone] Contact Person: Jabier Mutton Website: [email or web address].Physicist, medical) Offered: Residential treatment facility for women; food and  clothing, educational & employment development and  transportation to work; Counsellor of financial skills;  parenting and family reunification; emotional and spiritual  support;  transitional housing for program graduates.  Agency Name: [facility] Address: [address] [place], [place] [address] Phone: [phone] Email: [email or web address] Website: [email or web address].es Service(s) Offered: Public housing units for elderly, disabled, and low income people; housing choice vouchers for income eligible  applicants; shelter plus care vouchers; and Psychologist, clinical.  Agency Name: [facility] [employer] Address: [address], [place], [place] [address] Phone: [phone] Email: [email or web address] Website: www.habitatalamance.org Service(s) Offered: Build houses for families in need of decent housing. Each adult in the family must invest 200 hours of labor on  someone else's house, work with volunteers to build their own house, attend classes  on budgeting, home maintenance, yard care, and attend homeowner association meetings.  Agency Name: [facility] Address: [address], [place], [place] [address] Phone: [phone] Website: www.rsli.org Service(s) Offered: Intermediate care facilities for intellectually delayed, Supervised Living in group homes for adults with developmental disabilities, Supervised Living for people who have dual diagnoses (MRMI), Independent Living, Supported Living, respite and a variety of CAP services, pre-vocational services, day supports, and [employer].  Agency Name: [place] [employer] Phone: [phone] Website: www.NCForeclosurePrevention.gov Service(s) Offered: Zero-interest, deferred loans to homeowners struggling to pay their mortgage. Call for more information.    Shelters Resource List  [employer] RESCUE [facility] PROVIDED BY: [facility] [address] [place], [place], [place] Offers a faith-based shelter for homeless men, usually with substance use disorders. Residents receive counseling, life skills training, and help finding  a job.  HOMELESS SHELTER PROVIDED BY: [facility] [address] [place], [place], [place] Offers a shelter for men, women, and families experiencing homelessness. Food, clothing and other items are available for residents. Also offers support and services to help residents become self-sufficient. Offers temporary emergency housing for 30 days. Additional shelter may be available when temperatures drop below freezing but is not guaranteed.  FAMILY ABUSE SERVICES OF [facility] COUNTY PROVIDED BY: FAMILY ABUSE SERVICES OF [facility] [address], [place], [place] Offers services for victims of domestic violence. Offers a 24-hour crisis line and emergency shelter. Offers information and referrals to other community resources. Also offers court advocacy and support groups.  HOUSING CHOICE VOUCHER PROGRAM PROVIDED BY: [facility] [place] [address], [place], [place] Offers vouchers for approved Section 8 properties. Vouchers offer financial help with rent    [employer] PROVIDED BY: [facility], [place], [place] Offers emergency shelter for victims of domestic violence. Also offers a 24-hour crisis hotline for victims of domestic violence, safety planning, information and referrals, case management, and support groups for victims of domestic violence.   ACT TOGETHER EMERGENCY SHELTER PROVIDED BY: [facility] [address], [place], [place] Offers a 21-day emergency shelter for youth experiencing a family crisis, abuse, or homelessness. Case management, supportive services, healthcare services, and more are available for residents. SHELTER PROVIDED BY: [facility] [address], [place], [place] Offers a homeless shelter for people and families. Meals, showers, community referrals, case management, and more are available for residents. HEARTH TRANSITIONAL LIVING PROGRAM PROVIDED BY: [facility] [address], [place], [place] Offers an 45-month homeless shelter for younger adults experiencing homelessness. Case management, independent living skills education, and more are available for residents. PARTNERSHIP VILLAGE PROVIDED BY: [place] [facility] [address], [place], [place] Offers transitional housing for families and single people experiencing homelessness. Residents meet regularly with a case manager to work towards self-sufficiency TRANSITIONAL HOUSING PROVIDED BY: [facility] [address], [place], [place] Offers transitional housing for male veterans with disabilities. Residents receive meals, transportation, and clothing. Also offers support groups, nutrition classes, and peer support to residents.   WEAVER HOUSE PROVIDED BY: [facility] [address], [place], [place] Offers shelter to adult men and women. Guests receive hot meals and case management. Also offers overnight shelter when temperatures drop during cold winter months  EMERGENCY FAMILY SHELTER PROVIDED BY: [facility] [address], ,  Offers shelter and support services for families experiencing homelessness.
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
## 2023-10-09 NOTE — ED Notes (Signed)
 Difficult stick. Lab called.

## 2023-10-09 NOTE — ED Provider Notes (Signed)
 [facility] Provider Note    Event Date/Time   First MD Initiated Contact with Patient 10/09/23 1446     (approximate)  History   Chief Complaint: Numbness and Weakness (For months )  HPI  Raymond Archer is a 23 y.o. 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  According to the patient he is homeless living in his vehicle, he states over the last few months he has began to notice some tingling and intermittent numbness sensation in his left arm.  Patient states when he was initially diagnosed with the tumor and had the resection he was having some right-sided deficits which have continued but he never had left-sided issues into the last couple months.  As a secondary complaint patient has a rash to the right side of his face and scalp which he states has been present for approximately 3 years since they performed a resection.  Patient was initially seeing dermatology but states since he moved to this area he has not seen a primary care doctor or dermatologist.  As a third complaint patient states he is out of his typical medications such as blood pressure medication for the last 6+ months and would like a referral to a new PCP.  Physical Exam   Triage Vital Signs: ED Triage Vitals  Encounter Vitals Group     BP 10/09/23 1305 (!) 165/89     Girls Systolic BP Percentile --      Girls Diastolic BP Percentile --      Boys Systolic BP Percentile --      Boys Diastolic BP Percentile --      Pulse Rate 10/09/23 1305 (!) 106     Resp 10/09/23 1305 20     Temp 10/09/23 1305 98.1 F (36.7 C)     Temp Source 10/09/23 1305 Oral     SpO2 10/09/23 1305 100 %     Weight 10/09/23 1306 (!) 370 lb (167.8 kg)     Height 10/09/23 1306 5' 10 (1.778 m)     Head Circumference --      Peak Flow --      Pain Score 10/09/23 1305 5      Pain Loc --      Pain Education --      Exclude from Growth Chart --     Most recent vital signs: Vitals:   10/09/23 1305 10/09/23 1429  BP: (!) 165/89 (!) 148/92  Pulse: (!) 106 90  Resp: 20 19  Temp: 98.1 F (36.7 C)   SpO2: 100% 99%    General: Awake, no distress.  Patient has fairly large areas of rash to the right scalp and right face.  Patient states they have been present for 3 years or so at this point.  Hemostatic. CV:  Good peripheral perfusion.  Regular rate and rhythm  Resp:  Normal effort.  Equal breath sounds bilaterally.  Abd:  No distention.  Soft, nontender.  No rebound or guarding.  ED Results / Procedures / Treatments   EKG  EKG viewed and interpreted by myself shows a normal sinus rhythm at 97 bpm with a narrow QRS, normal axis, normal intervals, no concerning ST changes.  RADIOLOGY  I have reviewed interpret the CT head images.  I do not appreciate any significant mass calcification or bleed seen on my evaluation   MEDICATIONS ORDERED IN ED:  Medications - No data to display   IMPRESSION / MDM / ASSESSMENT AND PLAN / ED COURSE  I reviewed the triage vital signs and the nursing notes.  Patient's presentation is most consistent with acute presentation with potential threat to life or bodily function.  Patient presents the emergency department for multiple complaints.  Most concerning which is the patient states over the last few months he has been experiencing numbness and tingling at times in his left upper extremity.  Given the patient's history of a prior CNS tumor status post resection this is concerning.  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  Given the patient's history of a prior CNS tumor in the brain we will obtain MRI with and without contrast of the brain in addition to the C and T spine.  Patient's lab work is reassuring with  overall reassuring chemistry just slight LFT elevation negative alcohol, normal CBC.  I was able to review the patient's notes he was taking benazepril as well as hydrochlorothiazide.  Will prescribe these medications for the patient to [facility] pharmacy.  I have placed a referral for a primary care doctor for the patient.  I have prescribed bacitracin ointment to be used to the affected skin areas.  Patient states this is chronic for at least 2.5 to 3 years.  Will refer to dermatology for further evaluation such as a biopsy.  Patient agreeable to plan.  Patient care signed out to oncoming provider MRI pending.  FINAL CLINICAL IMPRESSION(S) / ED DIAGNOSES   Paresthesias   Note:  This document was prepared using Dragon voice recognition software and may include unintentional dictation errors.   Ruth Cove, MD 10/09/23 1527

## 2023-10-09 NOTE — ED Provider Notes (Signed)
 MRI without any acute abnormality. Discussed with patient importance of follow up with neurosurgery. Will discharge with prescriptions provided by Dr. Azalee Bolds.   Marylynn Soho, MD 10/09/23 [phone]

## 2023-10-10 ENCOUNTER — Other Ambulatory Visit: Payer: Self-pay

## 2023-10-10 MED ORDER — OMEPRAZOLE 20 MG PO CPDR
20.0000 mg | DELAYED_RELEASE_CAPSULE | Freq: Every day | ORAL | 1 refills | Status: AC
  Filled 2023-10-10: qty 30, 30d supply, fill #0

## 2023-10-10 NOTE — ED Provider Notes (Signed)
-----------------------------------------   6:25 AM on 10/10/2023 -----------------------------------------   Blood pressure (!) 153/112, pulse 80, temperature 97.9 F (36.6 C), temperature source Oral, resp. rate 17, height 5' 10 (1.778 m), weight (!) 167.8 kg, SpO2 100%.  The patient is calm and cooperative at this time.  There have been no acute events since the last update.  TOC consult placed as patient is unhoused, currently living in his vehicle.  History of medical issues including hypertension unable to afford medications.  Awaiting disposition plan from Social Work team.   Eh Sauseda J, MD 10/10/23 [phone]

## 2023-10-10 NOTE — TOC Initial Note (Addendum)
 Transition of Care [facility]) - Initial/Assessment Note    Patient Details  Name: Raymond Archer MRN: [id number] Date of Birth: [birth year]  Transition of Care [facility]) CM/SW Contact:    Elsie Halo, RN Phone Number: 10/10/2023, 1:17 PM  Clinical Narrative:                  [facility] visited the patient in [facility]. The patient has been living in his vehicle in his aunt's driveway for the past 7 months.  Before that, he lived with his adopted father. The patient advised that his father kicked him out when he quit his job. He felt the working conditions were unsafe and couldn't continue working in those conditions. 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 The patient advised he is now having numbness to his LUE.   The patient advised he applies for SSDI but it was denied because he is L hand dominant. He states he has applied for numerous jobs, but has not been hired bc of his health. He has reapplied for SSDI with an attempt to use his father's benefits.   Per the patient his concern is not his living situation, it is obtaining legal assistance with filing for SSDI.  TOC placed [employer] on the AVS as well as  [place] housing resources and a list of shelters.   Expected Discharge Plan: Home/Self Care Barriers to Discharge: Continued Medical Work up   Patient Goals and CMS Choice            Expected Discharge Plan and Services   Discharge Planning Services: CM Consult   Living arrangements for the past 2 months: Homeless (lives in a car in his Aunt's driveway)                                      Prior Living Arrangements/Services Living arrangements for the past 2 months: Homeless (lives in a car in his Aunt's driveway) Lives with:: Self                   Activities of Daily Living      Permission Sought/Granted                  Emotional Assessment Appearance:: Appears stated  age Attitude/Demeanor/Rapport: Engaged Affect (typically observed): Appropriate Orientation: : Oriented to Self, Oriented to Place, Oriented to  Time, Oriented to Situation      Admission diagnosis:  Wound check There are no active problems to display for this patient.  PCP:  Nestor Banter, MD Pharmacy:   [facility] [address] [place] [address] Phone: [phone] Fax: [phone]     Social Drivers of Health (SDOH) Social History: SDOH Screenings   Transportation Needs: No Transportation Needs (05/11/2021)   Received from [facility]  Financial Resource Strain: High Risk (05/19/2021)   Received from [facility]  Tobacco Use: Low Risk  (10/09/2023)   SDOH Interventions:     Readmission Risk Interventions     No data to display

## 2023-10-20 ENCOUNTER — Other Ambulatory Visit: Payer: Self-pay

## 2023-10-20 ENCOUNTER — Emergency Department
Admission: EM | Admit: 2023-10-20 | Discharge: 2023-10-20 | Disposition: A | Discharge status: Home / Self Care | Attending: Emergency Medicine | Admitting: Emergency Medicine

## 2023-10-20 DIAGNOSIS — Z5189 Encounter for other specified aftercare: Secondary | ICD-10-CM

## 2023-10-20 DIAGNOSIS — I10 Essential (primary) hypertension: Secondary | ICD-10-CM | POA: Insufficient documentation

## 2023-10-20 DIAGNOSIS — Z5900 Homelessness unspecified: Secondary | ICD-10-CM | POA: Insufficient documentation

## 2023-10-20 DIAGNOSIS — Z4800 Encounter for change or removal of nonsurgical wound dressing: Secondary | ICD-10-CM | POA: Diagnosis present

## 2023-10-20 LAB — BASIC METABOLIC PANEL WITH GFR
Anion gap: 9 (ref 5–15)
BUN: 6 mg/dL (ref 6–20)
CO2: 27 mmol/L (ref 22–32)
Calcium: 8.5 mg/dL — ABNORMAL LOW (ref 8.9–10.3)
Chloride: 99 mmol/L (ref 98–111)
Creatinine, Ser: 0.68 mg/dL (ref 0.61–1.24)
GFR, Estimated: >60 mL/min (ref >60)
Glucose, Bld: 149 mg/dL — ABNORMAL HIGH (ref 70–99)
Potassium: 3.5 mmol/L (ref 3.5–5.1)
Sodium: 135 mmol/L (ref 135–145)

## 2023-10-20 LAB — CBC
HCT: 42.7 % (ref 39.0–52.0)
Hemoglobin: 13.5 g/dL (ref 13.0–17.0)
MCH: 24.9 pg — ABNORMAL LOW (ref 26.0–34.0)
MCHC: 31.6 g/dL (ref 30.0–36.0)
MCV: 78.8 fL — ABNORMAL LOW (ref 80.0–100.0)
Platelets: 360 10*3/uL (ref 150–400)
RBC: 5.42 MIL/uL (ref 4.22–5.81)
RDW: 14.3 % (ref 11.5–15.5)
WBC: 9.2 10*3/uL (ref 4.0–10.5)
nRBC: 0 % (ref 0.0–0.2)

## 2023-10-20 MED ORDER — CEPHALEXIN 500 MG PO CAPS: 500.0000 mg | ORAL_CAPSULE | Freq: Three times a day (TID) | ORAL | 0 refills | Status: AC

## 2023-10-20 NOTE — ED Triage Notes (Signed)
 Pt to ED via POV. Pt has wound to right frontal area and rigth cheek that has been present x3 years. Wound appears to look like a burn and is large in diameter on right side of head. Pt has been seen by dermatology for his face wounds. Pt reports family has been telling him the wound has been bleeding intermittently.   Pt states hx of tumor on brain in 2022 and has decreased sensation on right side of body.

## 2023-10-20 NOTE — ED Provider Notes (Signed)
 [facility] Provider Note    Event Date/Time   First MD Initiated Contact with Patient 10/20/23 1132     (approximate)   History   Wound Check   HPI  Raymond Archer is a 23 y.o. male history of brain tumor, hyperlipidemia, hypertension presents emergency department with a wound on his scalp.  Patient states has had the wound there for 3 years but recently it started draining like infection.  Was seen here and given bacitracin  to put on the area.  States was working well but there is some yellow discharge to it.  Denies fever or chills.  Patient is homeless.  He states he sleeps in his car.  States social services is wearing and they are trying to help him find housing.  However he does have Medicaid to help pay for his medications.      Physical Exam   Triage Vital Signs: ED Triage Vitals  Encounter Vitals Group     BP 10/20/23 1106 129/64     Girls Systolic BP Percentile --      Girls Diastolic BP Percentile --      Boys Systolic BP Percentile --      Boys Diastolic BP Percentile --      Pulse Rate 10/20/23 1106 100     Resp 10/20/23 1106 20     Temp 10/20/23 1106 98.3 F (36.8 C)     Temp src --      SpO2 10/20/23 1106 97 %     Weight 10/20/23 1107 (!) 370 lb 6 oz (168 kg)     Height 10/20/23 1107 5' 10 (1.778 m)     Head Circumference --      Peak Flow --      Pain Score 10/20/23 1107 4     Pain Loc --      Pain Education --      Exclude from Growth Chart --     Most recent vital signs: Vitals:   10/20/23 1106  BP: 129/64  Pulse: 100  Resp: 20  Temp: 98.3 F (36.8 C)  SpO2: 97%     General: Awake, no distress.   CV:  Good peripheral perfusion.  Resp:  Normal effort.  Abd:  No distention.   Other:  Scalp with very large eroded area and 1 small area on the cheek, no active bleeding, no active pustules, some serosanguineous fluid which would indicate some healing process.   ED Results / Procedures / Treatments    Labs (all labs ordered are listed, but only abnormal results are displayed) Labs Reviewed  CBC - Abnormal; Notable for the following components:      Result Value   MCV 78.8 (*)    MCH 24.9 (*)    All other components within normal limits  BASIC METABOLIC PANEL WITH GFR - Abnormal; Notable for the following components:   Glucose, Bld 149 (*)    Calcium 8.5 (*)    All other components within normal limits     EKG     RADIOLOGY     PROCEDURES:   Procedures  Critical Care:  no Chief Complaint  Patient presents with   Wound Check      MEDICATIONS ORDERED IN ED: Medications - No data to display   IMPRESSION / MDM / ASSESSMENT AND PLAN / ED COURSE  I reviewed the triage vital signs and the nursing notes.  Differential diagnosis includes, but is not limited to, wound care, recheck of wound, cellulitis  Patient's presentation is most consistent with acute complicated illness / injury requiring diagnostic workup.    Wound appears to be chronic in nature, does not appear to be eroding through to the bone.  I think the best plan of action at this time is for the patient to go to the wound care center.  I will go ahead and put him on Keflex 500 3 times daily.  He states his phone does not work except for on Wi-Fi.  I encouraged him to go to somewhere like [employer] or the library that would have Wi-Fi and he could use this to make an appointment with the wound care center.  Otherwise it may be difficult for them to get in touch with him.  He states he understands.  He is in agreement treatment plan.  Discharged stable condition.      FINAL CLINICAL IMPRESSION(S) / ED DIAGNOSES   Final diagnoses:  Visit for wound check     Rx / DC Orders   ED Discharge Orders          Ordered    cephALEXin (KEFLEX) 500 MG capsule  3 times daily        10/20/23 1212    AMB referral to wound care center        10/20/23 1213              Note:  This document was prepared using Dragon voice recognition software and may include unintentional dictation errors.    Gasper Devere ORN, PA-C 10/20/23 1218    Waymond Lorelle Cummins, MD 10/20/23 [phone]

## 2023-10-20 NOTE — ED Notes (Signed)
 Patient given a shasta cola per request.

## 2023-11-08 ENCOUNTER — Ambulatory Visit: Admitting: Physician Assistant

## 2024-06-26 ENCOUNTER — Encounter: Admitting: Physician Assistant
